# Patient Record
Sex: Male | Born: 1985 | Race: White | Hispanic: No | Marital: Married | Smoking: Never smoker
Health system: Southern US, Community
[De-identification: ages and names within clinical notes are randomized; demographics above are authoritative.]

## PROBLEM LIST (undated history)

## (undated) DIAGNOSIS — S069XAA Unspecified intracranial injury with loss of consciousness status unknown, initial encounter: Secondary | ICD-10-CM

## (undated) DIAGNOSIS — F4310 Post-traumatic stress disorder, unspecified: Secondary | ICD-10-CM

## (undated) DIAGNOSIS — S069X9A Unspecified intracranial injury with loss of consciousness of unspecified duration, initial encounter: Secondary | ICD-10-CM

## (undated) DIAGNOSIS — F1121 Opioid dependence, in remission: Secondary | ICD-10-CM

## (undated) DIAGNOSIS — G8929 Other chronic pain: Secondary | ICD-10-CM

## (undated) DIAGNOSIS — R51 Headache: Secondary | ICD-10-CM

## (undated) HISTORY — PX: HERNIA REPAIR: SHX51

## (undated) HISTORY — DX: Unspecified intracranial injury with loss of consciousness of unspecified duration, initial encounter: S06.9X9A

## (undated) HISTORY — DX: Post-traumatic stress disorder, unspecified: F43.10

## (undated) HISTORY — DX: Headache: R51

## (undated) HISTORY — DX: Other chronic pain: G89.29

## (undated) HISTORY — DX: Opioid dependence, in remission: F11.21

## (undated) HISTORY — DX: Unspecified intracranial injury with loss of consciousness status unknown, initial encounter: S06.9XAA

---

## 2009-01-03 HISTORY — PX: SHOULDER SURGERY: SHX246

## 2012-01-16 ENCOUNTER — Emergency Department (HOSPITAL_COMMUNITY)
Admission: EM | Admit: 2012-01-16 | Discharge: 2012-01-16 | Disposition: A | Discharge status: Home / Self Care | Payer: Self-pay | Attending: Emergency Medicine | Admitting: Emergency Medicine

## 2012-01-16 ENCOUNTER — Emergency Department (HOSPITAL_COMMUNITY): Payer: Self-pay

## 2012-01-16 ENCOUNTER — Encounter (HOSPITAL_COMMUNITY): Payer: Self-pay | Admitting: *Deleted

## 2012-01-16 DIAGNOSIS — Y929 Unspecified place or not applicable: Secondary | ICD-10-CM | POA: Insufficient documentation

## 2012-01-16 DIAGNOSIS — S51809A Unspecified open wound of unspecified forearm, initial encounter: Secondary | ICD-10-CM | POA: Insufficient documentation

## 2012-01-16 DIAGNOSIS — Y939 Activity, unspecified: Secondary | ICD-10-CM | POA: Insufficient documentation

## 2012-01-16 DIAGNOSIS — IMO0002 Reserved for concepts with insufficient information to code with codable children: Secondary | ICD-10-CM

## 2012-01-16 DIAGNOSIS — X58XXXA Exposure to other specified factors, initial encounter: Secondary | ICD-10-CM | POA: Insufficient documentation

## 2012-01-16 DIAGNOSIS — Z9889 Other specified postprocedural states: Secondary | ICD-10-CM | POA: Insufficient documentation

## 2012-01-16 MED ORDER — IBUPROFEN 100 MG/5ML PO SUSP: 800.0000 mg | Freq: Once | ORAL | Status: DC

## 2012-01-16 NOTE — ED Provider Notes (Signed)
History     CSN: [id number]  Arrival date & time 01/16/12  2008   First MD Initiated Contact with Patient 01/16/12 2057      Chief Complaint  Patient presents with  . Extremity Laceration    (Consider location/radiation/quality/duration/timing/severity/associated sxs/prior treatment) Patient is a 27 y.o. male presenting with skin laceration. The history is provided by the patient.  Laceration  The incident occurred 3 to 5 hours ago. The laceration is located on the left arm. The laceration is 3 cm in size. The laceration mechanism is unknown.The pain is mild. It is unknown if a foreign body is present. His tetanus status is UTD.    History reviewed. No pertinent past medical history.  Past Surgical History  Procedure Date  . Shoulder surgery     History reviewed. No pertinent family history.  History  Substance Use Topics  . Smoking status: Never Smoker   . Smokeless tobacco: Current User    Types: Chew  . Alcohol Use: Yes     Comment: rarely      Review of Systems  Constitutional: Negative for fever and chills.  Musculoskeletal: Negative.   Skin: Positive for wound.       Laceration.  Neurological: Negative.  Negative for numbness.    Allergies  Review of patient's allergies indicates no known allergies.  Home Medications   Current Outpatient Rx  Name  Route  Sig  Dispense  Refill  . ACETAMINOPHEN 500 MG PO TABS   Oral   Take 500 mg by mouth daily as needed. For pain           BP 143/79  Pulse 113  Temp 99.1 F (37.3 C) (Oral)  Resp 18  SpO2 100%  Physical Exam  Constitutional: He is oriented to person, place, and time. He appears well-developed and well-nourished.  Neck: Normal range of motion.  Cardiovascular:       Distal pulse intact.  Pulmonary/Chest: Effort normal.  Musculoskeletal: Normal range of motion.  Neurological: He is alert and oriented to person, place, and time.  Skin: Skin is warm and dry.       Full thickness, 3 cm  laceration to left volar forearm without swelling or palpable foreign body.  Psychiatric: He has a normal mood and affect.    ED Course  Procedures (including critical care time)  Labs Reviewed - No data to display Dg Forearm Left  01/16/2012  *RADIOLOGY REPORT*  Clinical Data: Extremity two-view left forearm  LEFT FOREARM - 2 VIEW  Comparison: None.  Findings: There is soft tissue swelling radiation into the area demarcated by the radiopaque arrow.  This finding without associated radiopaque foreign body or fracture. Limited visualization of the adjacent wrist and elbow is normal.  IMPRESSION: Soft tissue swelling without radiopaque foreign body or fracture.   Original Report Authenticated By: Tacey Ruiz, MD   LACERATION REPAIR Performed by: Arnoldo Hooker Authorized by: Elpidio Anis A Consent: Verbal consent obtained. Risks and benefits: risks, benefits and alternatives were discussed Consent given by: patient Patient identity confirmed: provided demographic data Prepped and Draped in normal sterile fashion Wound explored  Laceration Location: left forearm  Laceration Length: 3cm  No Foreign Bodies seen or palpated  Anesthesia: local infiltration  Local anesthetic: lidocaine 1% w/ epinephrine  Anesthetic total: 2 ml  Irrigation method: syringe Amount of cleaning: standard  Skin closure: staples  Number of sutures: 3  Technique: staples  Patient tolerance: Patient tolerated the procedure well with no  immediate complications.    No diagnosis found.  1. Left forearm laceration  MDM  Uncomplicated laceration forearm.        Arnoldo Hooker, PA-C 01/16/12 2201

## 2012-01-16 NOTE — ED Notes (Signed)
Pt states he fell in backyard and may have landed on something metal.  laceration to the left forearm, bleeding controlled at this time.

## 2012-01-16 NOTE — ED Notes (Signed)
Pt states tetanus shot received within 5 yrs. Laceration to left anterior forearm. Covered with gauze. A&Ox4, ambulatory, nad.

## 2012-01-16 NOTE — ED Notes (Signed)
Pt in xray

## 2012-01-17 NOTE — ED Provider Notes (Signed)
Medical screening examination/treatment/procedure(s) were performed by non-physician practitioner and as supervising physician I was immediately available for consultation/collaboration.  Gilda Crease, MD 01/17/12 2325

## 2012-03-07 ENCOUNTER — Encounter: Payer: Self-pay | Admitting: Internal Medicine

## 2012-03-07 ENCOUNTER — Ambulatory Visit (INDEPENDENT_AMBULATORY_CARE_PROVIDER_SITE_OTHER): Payer: PRIVATE HEALTH INSURANCE | Admitting: Internal Medicine

## 2012-03-07 VITALS — BP 112/72 | HR 76 | Temp 98.0°F | Ht 69.5 in | Wt 136.0 lb

## 2012-03-07 DIAGNOSIS — F4310 Post-traumatic stress disorder, unspecified: Secondary | ICD-10-CM

## 2012-03-07 DIAGNOSIS — S069X0S Unspecified intracranial injury without loss of consciousness, sequela: Secondary | ICD-10-CM

## 2012-03-07 DIAGNOSIS — L709 Acne, unspecified: Secondary | ICD-10-CM | POA: Insufficient documentation

## 2012-03-07 DIAGNOSIS — L708 Other acne: Secondary | ICD-10-CM

## 2012-03-07 DIAGNOSIS — S069X9S Unspecified intracranial injury with loss of consciousness of unspecified duration, sequela: Secondary | ICD-10-CM

## 2012-03-07 DIAGNOSIS — R51 Headache: Secondary | ICD-10-CM

## 2012-03-07 LAB — CBC WITH DIFFERENTIAL/PLATELET
Basophils Absolute: 0 10*3/uL (ref 0.0–0.1)
Eosinophils Absolute: 0 10*3/uL (ref 0.0–0.7)
Hemoglobin: 15.3 g/dL (ref 13.0–17.0)
Lymphocytes Relative: 41.8 % (ref 12.0–46.0)
Lymphs Abs: 2.9 10*3/uL (ref 0.7–4.0)
MCHC: 34.1 g/dL (ref 30.0–36.0)
Monocytes Absolute: 0.5 10*3/uL (ref 0.1–1.0)
Neutro Abs: 3.5 10*3/uL (ref 1.4–7.7)
RDW: 12.3 % (ref 11.5–14.6)

## 2012-03-07 LAB — T4, FREE: Free T4: 0.79 ng/dL (ref 0.60–1.60)

## 2012-03-07 LAB — HEPATIC FUNCTION PANEL
Albumin: 4.4 g/dL (ref 3.5–5.2)
Alkaline Phosphatase: 48 U/L (ref 39–117)

## 2012-03-07 LAB — BASIC METABOLIC PANEL
CO2: 32 mEq/L (ref 19–32)
Calcium: 9.6 mg/dL (ref 8.4–10.5)
Glucose, Bld: 79 mg/dL (ref 70–99)
Sodium: 142 mEq/L (ref 135–145)

## 2012-03-07 LAB — TSH: TSH: 1.38 u[IU]/mL (ref 0.35–5.50)

## 2012-03-07 MED ORDER — METHOCARBAMOL 500 MG PO TABS: 250.0000 mg | ORAL_TABLET | Freq: Two times a day (BID) | ORAL | Status: DC | PRN

## 2012-03-07 MED ORDER — ONDANSETRON HCL 4 MG PO TABS: 4.0000 mg | ORAL_TABLET | Freq: Three times a day (TID) | ORAL | Status: DC | PRN

## 2012-03-07 MED ORDER — BUPROPION HCL ER (XL) 150 MG PO TB24: 150.0000 mg | ORAL_TABLET | Freq: Every day | ORAL | Status: DC

## 2012-03-07 MED ORDER — MINOCYCLINE HCL 50 MG PO TABS: 50.0000 mg | ORAL_TABLET | Freq: Two times a day (BID) | ORAL | Status: DC

## 2012-03-07 NOTE — Assessment & Plan Note (Addendum)
Patient suffers from PTSD. He also reports he was diagnosed with ADD. He has tried and failed multiple medications including Abilify, Cymbalta, Lexapro and Zoloft. Trial of Wellbutrin XL 150 mg once daily. Refer to psychiatrist for further evaluation.

## 2012-03-07 NOTE — Assessment & Plan Note (Signed)
Patient complains of chronic back and his back. His tried and failed topical agents. Use minocycline 50 mg once daily.

## 2012-03-07 NOTE — Assessment & Plan Note (Signed)
Patient suffers with chronic headaches. This is likely secondary to history of chronic pain disorder/multiple concussions. He is evaluated by neurologist at the [facility] in the past. Extensive workup negative. Patient reports trying amitriptyline the past. He discontinued due to excessive somnolence. I strongly recommended he discontinue Use of over-the-counter analgesics. Patient at risk for NSAID ulcers. Trial of muscle relaxers for headache.
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
## 2012-04-09 ENCOUNTER — Ambulatory Visit (INDEPENDENT_AMBULATORY_CARE_PROVIDER_SITE_OTHER): Payer: PRIVATE HEALTH INSURANCE | Admitting: Internal Medicine

## 2012-04-09 ENCOUNTER — Encounter: Payer: Self-pay | Admitting: Internal Medicine

## 2012-04-09 VITALS — BP 130/80 | HR 84 | Temp 97.9°F | Wt 138.0 lb

## 2012-04-09 DIAGNOSIS — F4310 Post-traumatic stress disorder, unspecified: Secondary | ICD-10-CM

## 2012-04-09 DIAGNOSIS — R51 Headache: Secondary | ICD-10-CM

## 2012-04-09 MED ORDER — POLYETHYLENE GLYCOL 3350 17 GM/SCOOP PO POWD: 17.0000 g | Freq: Two times a day (BID) | ORAL | Status: DC | PRN

## 2012-04-09 MED ORDER — BUPROPION HCL ER (XL) 300 MG PO TB24: 300.0000 mg | ORAL_TABLET | Freq: Every day | ORAL | Status: DC

## 2012-04-09 MED ORDER — ONDANSETRON HCL 4 MG PO TABS: 4.0000 mg | ORAL_TABLET | Freq: Three times a day (TID) | ORAL | Status: DC | PRN
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
## 2012-04-09 NOTE — Assessment & Plan Note (Signed)
Some improvement with Wellbutrin XL 150 mg daily. Titrate to 300 mg. Patient requests evaluation for ADD. Change psychiatry referral to Dr. Betti Cruz.

## 2012-04-09 NOTE — Progress Notes (Signed)
  Subjective:    Patient ID: Wesley Mason, male    DOB: November 08, 1985, 27 y.o.   MRN: [id number]  HPI  27 year old [place] male previously seen for PTSD chronic headaches for followup. Patient tried Robaxin for chronic headaches. Patient increased dose to 40 mg but noticed no improvement. Patient also tried and failed amitriptyline in the past.  Zofran helping chronic nausea but it is causing constipation.   PTSD-Wellbrutin XL 150 mg is helping. Patient feels like he needs higher dose. He was referred to psychiatrist but could not follow through due to cost reasons.  Patient requests evaluation for ADD. Patient reports he was diagnosed with ADD during childhood. He took stimulants in the remote past.  Review of Systems Denies adverse effect from Wellbutrin  Past Medical History  Diagnosis Date  . Traumatic brain injury   . PTSD (post-traumatic stress disorder)   . Chronic headaches   . History of narcotic addiction     History   Social History  . Marital Status: Married    Spouse Name: N/A    Number of Children: N/A  . Years of Education: N/A   Occupational History  . Not on file.   Social History Main Topics  . Smoking status: Never Smoker   . Smokeless tobacco: Current User    Types: Chew  . Alcohol Use: Yes     Comment: rarely  . Drug Use: No  . Sexually Active:    Other Topics Concern  . Not on file   Social History Narrative  . No narrative on file    Past Surgical History  Procedure Laterality Date  . Shoulder surgery  2011    History of anterior/posterior labrum tear    Family History  Problem Relation Age of Onset  . Alcoholism Father   . Breast cancer Maternal Grandmother   . Pancreatic cancer Paternal Grandfather   . Depression Mother     No Known Allergies  Current Outpatient Prescriptions on File Prior to Visit  Medication Sig Dispense Refill  . minocycline (DYNACIN) 50 MG tablet Take 1 tablet (50 mg total) by mouth 2 (two) times daily.   30 tablet  1   No current facility-administered medications on file prior to visit.    BP 130/80  Pulse 84  Temp(Src) 97.9 F (36.6 C) (Oral)  Wt 138 lb (62.596 kg)  BMI 20.09 kg/m2       Objective:   Physical Exam  Constitutional: He is oriented to person, place, and time. He appears well-developed and well-nourished.  Cardiovascular: Normal rate, regular rhythm and normal heart sounds.   No murmur heard. Pulmonary/Chest: Effort normal and breath sounds normal. He has no wheezes.  Neurological: He is alert and oriented to person, place, and time. No cranial nerve deficit.  Psychiatric: He has a normal mood and affect. His behavior is normal.          Assessment & Plan:

## 2012-05-08 ENCOUNTER — Encounter (HOSPITAL_COMMUNITY): Payer: Self-pay

## 2012-05-08 ENCOUNTER — Emergency Department (HOSPITAL_COMMUNITY): Payer: PRIVATE HEALTH INSURANCE

## 2012-05-08 ENCOUNTER — Emergency Department (HOSPITAL_COMMUNITY)
Admission: EM | Admit: 2012-05-08 | Discharge: 2012-05-08 | Disposition: A | Discharge status: Home / Self Care | Payer: PRIVATE HEALTH INSURANCE | Attending: Emergency Medicine | Admitting: Emergency Medicine

## 2012-05-08 DIAGNOSIS — Z87820 Personal history of traumatic brain injury: Secondary | ICD-10-CM | POA: Insufficient documentation

## 2012-05-08 DIAGNOSIS — Y939 Activity, unspecified: Secondary | ICD-10-CM | POA: Insufficient documentation

## 2012-05-08 DIAGNOSIS — Z79899 Other long term (current) drug therapy: Secondary | ICD-10-CM | POA: Insufficient documentation

## 2012-05-08 DIAGNOSIS — Z9889 Other specified postprocedural states: Secondary | ICD-10-CM | POA: Insufficient documentation

## 2012-05-08 DIAGNOSIS — S46909A Unspecified injury of unspecified muscle, fascia and tendon at shoulder and upper arm level, unspecified arm, initial encounter: Secondary | ICD-10-CM | POA: Insufficient documentation

## 2012-05-08 DIAGNOSIS — X500XXA Overexertion from strenuous movement or load, initial encounter: Secondary | ICD-10-CM | POA: Insufficient documentation

## 2012-05-08 DIAGNOSIS — Y929 Unspecified place or not applicable: Secondary | ICD-10-CM | POA: Insufficient documentation

## 2012-05-08 DIAGNOSIS — Z8659 Personal history of other mental and behavioral disorders: Secondary | ICD-10-CM | POA: Insufficient documentation

## 2012-05-08 DIAGNOSIS — G8929 Other chronic pain: Secondary | ICD-10-CM | POA: Insufficient documentation

## 2012-05-08 DIAGNOSIS — R51 Headache: Secondary | ICD-10-CM | POA: Insufficient documentation

## 2012-05-08 DIAGNOSIS — M25512 Pain in left shoulder: Secondary | ICD-10-CM

## 2012-05-08 DIAGNOSIS — S4980XA Other specified injuries of shoulder and upper arm, unspecified arm, initial encounter: Secondary | ICD-10-CM | POA: Insufficient documentation

## 2012-05-08 MED ORDER — OXYCODONE-ACETAMINOPHEN 5-325 MG PO TABS: 1.0000 | ORAL_TABLET | ORAL | Status: DC | PRN

## 2012-05-08 MED ORDER — NAPROXEN 500 MG PO TABS: 500.0000 mg | ORAL_TABLET | Freq: Two times a day (BID) | ORAL | Status: DC

## 2012-05-08 NOTE — ED Provider Notes (Signed)
  Medical screening examination/treatment/procedure(s) were performed by non-physician practitioner and as supervising physician I was immediately available for consultation/collaboration.    Gerhard Munch, MD 05/08/12 2312

## 2012-05-08 NOTE — ED Provider Notes (Signed)
History    This chart was scribed for non-physician practitioner Dierdre Forth, PA-C working with Gerhard Munch, MD by Gerlean Ren, ED Scribe. This patient was seen in room WTR5/WTR5 and the patient's care was started at 9:09 PM.    CSN: [id number]  Arrival date & time 05/08/12  2026   First MD Initiated Contact with Patient 05/08/12 2041      Chief Complaint  Patient presents with  . Shoulder Pain     The history is provided by the patient. No language interpreter was used.  Wesley Mason is a 27 y.o. male who presents to the Emergency Department complaining of constant throbbing left shoulder pain rated as 8/10 and still worsening with sudden onset when pt was moving a washing machine during which pt felt his shoulder pop out of place and move back into place right away.  Pt has had post anterior labrum tear surgery after a dislocation which was performed at [facility] in 2010.  Pt reports at least one dislocation since the surgery.  Pt reports h/o PTSD that has been improving.  Pt regularly takes wellbutrin.  No known allergies.     Past Medical History  Diagnosis Date  . Traumatic brain injury   . PTSD (post-traumatic stress disorder)   . Chronic headaches   . History of narcotic addiction     Past Surgical History  Procedure Laterality Date  . Shoulder surgery  2011    History of anterior/posterior labrum tear    Family History  Problem Relation Age of Onset  . Alcoholism Father   . Breast cancer Maternal Grandmother   . Pancreatic cancer Paternal Grandfather   . Depression Mother     History  Substance Use Topics  . Smoking status: Never Smoker   . Smokeless tobacco: Current User    Types: Chew  . Alcohol Use: Yes     Comment: rarely      Review of Systems  Constitutional: Negative for fever and chills.  Respiratory: Negative for shortness of breath.   Gastrointestinal: Negative for nausea and vomiting.  Musculoskeletal: Positive for  arthralgias.       Positive left shoulder pain  Skin: Negative for wound.  Neurological: Negative for weakness.    Allergies  Review of patient's allergies indicates no known allergies.  Home Medications   Current Outpatient Rx  Name  Route  Sig  Dispense  Refill  . buPROPion (WELLBUTRIN XL) 300 MG 24 hr tablet   Oral   Take 300 mg by mouth every morning.         . naproxen (NAPROSYN) 500 MG tablet   Oral   Take 1 tablet (500 mg total) by mouth 2 (two) times daily with a meal.   30 tablet   0   . oxyCODONE-acetaminophen (PERCOCET/ROXICET) 5-325 MG per tablet   Oral   Take 1 tablet by mouth every 4 (four) hours as needed for pain.   12 tablet   0     BP 123/64  Pulse 99  Temp(Src) 98.5 F (36.9 C) (Oral)  Resp 18  SpO2 97%  Physical Exam  Nursing note and vitals reviewed. Constitutional: He is oriented to person, place, and time. He appears well-developed and well-nourished. No distress.  HENT:  Head: Normocephalic and atraumatic.  Eyes: Conjunctivae and EOM are normal.  Neck: Normal range of motion. Neck supple. No tracheal deviation present.  Cardiovascular: Normal rate, regular rhythm, normal heart sounds and intact distal pulses.   No  murmur heard. Capillary refill < 3 sec  Pulmonary/Chest: Effort normal and breath sounds normal. No respiratory distress. He has no wheezes.  Musculoskeletal: He exhibits tenderness. He exhibits no edema.       Left shoulder: He exhibits decreased range of motion, tenderness and pain. He exhibits no bony tenderness, no swelling, no effusion, no crepitus, no deformity, no laceration, no spasm, normal pulse and normal strength.       Left elbow: Normal.       Left wrist: Normal.  ROM: decreased ROM in the L shoulder 2/2 pain  Neurological: He is alert and oriented to person, place, and time. Coordination normal.  Sensation intact to dull and sharp Strength 4/5 in the LUE as compared to the RUE  Skin: Skin is warm and dry. No  rash noted. He is not diaphoretic. No erythema.  No tenting of the skin  Psychiatric: He has a normal mood and affect. His behavior is normal.    ED Course  Procedures (including critical care time) DIAGNOSTIC STUDIES: Oxygen Saturation is 97% on room air, adequate by my interpretation.    COORDINATION OF CARE: 9:20 PM- Informed pt that XR is negative for fracture or dislocation and that I can provide IM Toradol  here.  Informed pt that I cannot provide pain medicine here because he is driving and he understands. Ordered left arm sling.  Advised pt to follow-up with PCP.  Pt verbalizes understanding and agrees with plan.      Dg Shoulder Left  05/08/2012  *RADIOLOGY REPORT*  Clinical Data: Shoulder injury moving object.  LEFT SHOULDER - 2+ VIEW  Comparison: None.  Findings: Scoliosis thoracic spine.  No fracture or dislocation.  IMPRESSION: No fracture.   Original Report Authenticated By: Lacy Duverney, M.D.      1. Shoulder joint pain, left       MDM  Wesley Mason presents for acute exacerbation of chronic shoulder pain.  Patient X-Ray negative for obvious fracture or dislocation. I personally reviewed the imaging tests through PACS system.  I reviewed available ER/hospitalization records through the EMR.  Pt advised to follow up with orthopedics if symptoms persist for possibility of additional labrum tear. Patient given brace while in ED, conservative therapy recommended and discussed. Patient will be dc home & is agreeable with above plan.    I personally performed the services described in this documentation, which was scribed in my presence. The recorded information has been reviewed and is accurate.   Dahlia Client Kekai Geter, PA-C 05/08/12 2143
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
## 2012-05-15 ENCOUNTER — Ambulatory Visit (INDEPENDENT_AMBULATORY_CARE_PROVIDER_SITE_OTHER): Payer: PRIVATE HEALTH INSURANCE | Admitting: Internal Medicine

## 2012-05-15 ENCOUNTER — Encounter: Payer: Self-pay | Admitting: Internal Medicine

## 2012-05-15 ENCOUNTER — Telehealth: Payer: Self-pay | Admitting: Internal Medicine

## 2012-05-15 VITALS — BP 132/74 | Temp 98.6°F | Wt 133.0 lb

## 2012-05-15 DIAGNOSIS — M25519 Pain in unspecified shoulder: Secondary | ICD-10-CM

## 2012-05-15 DIAGNOSIS — M25512 Pain in left shoulder: Secondary | ICD-10-CM | POA: Insufficient documentation

## 2012-05-15 MED ORDER — HYDROCODONE-ACETAMINOPHEN 5-325 MG PO TABS: 1.0000 | ORAL_TABLET | Freq: Three times a day (TID) | ORAL | Status: DC | PRN

## 2012-05-15 MED ORDER — BUPROPION HCL ER (XL) 300 MG PO TB24: 300.0000 mg | ORAL_TABLET | Freq: Every morning | ORAL | Status: DC

## 2012-05-15 NOTE — Progress Notes (Signed)
  Subjective:    Patient ID: Wesley Mason, male    DOB: [birth year], 27 y.o.   MRN: [id number]  HPI  27 year old Wesley Mason male with history of previous left shoulder injury for ER followup. 3 days ago while moving a washer,he felt a ripping sensation in left shoulder. He also describes popping sound. He had left shoulder surgery in the past for a posterior and anterior labrum tear. Patient was given sling in the emergency room but he is not using as directed.  He has been using percocet for pain.   Review of Systems No redness or swelling of joint  Past Medical History  Diagnosis Date  . Traumatic brain injury   . PTSD (post-traumatic stress disorder)   . Chronic headaches   . History of narcotic addiction     History   Social History  . Marital Status: Married    Spouse Name: N/A    Number of Children: N/A  . Years of Education: N/A   Occupational History  . Not on file.   Social History Main Topics  . Smoking status: Never Smoker   . Smokeless tobacco: Current User    Types: Chew  . Alcohol Use: Yes     Comment: rarely  . Drug Use: No  . Sexually Active:    Other Topics Concern  . Not on file   Social History Narrative  . No narrative on file    Past Surgical History  Procedure Laterality Date  . Shoulder surgery  2011    History of anterior/posterior labrum tear    Family History  Problem Relation Age of Onset  . Alcoholism Father   . Breast cancer Maternal Grandmother   . Pancreatic cancer Paternal Grandfather   . Depression Mother     No Known Allergies  No current outpatient prescriptions on file prior to visit.   No current facility-administered medications on file prior to visit.    BP 132/74  Temp(Src) 98.6 F (37 C) (Oral)  Wt 133 lb (60.328 kg)  BMI 19.37 kg/m2       Objective:   Physical Exam  Constitutional: He appears well-nourished.  Cardiovascular: Normal rate, regular rhythm and normal heart sounds.   Pulmonary/Chest:  Effort normal and breath sounds normal. He has no wheezes.  Musculoskeletal:  Limited range of motion left shoulder, only able to achieve 90 abduction.  Pain with anterior translation.          Assessment & Plan:

## 2012-05-15 NOTE — Telephone Encounter (Signed)
error 

## 2012-05-15 NOTE — Assessment & Plan Note (Signed)
27 year old [place] male with previous history of left labrum tear presents with re injury.  Refer to [facility] ortho for further evaluation and treatment.  Patient advised to use shoulder sling and rest until further evaluation. He will likely need repeat MRI of left shoulder and possible surgical repair.  Use vicodin 5/325 q 8 hrs as needed for pain.

## 2012-05-15 NOTE — Patient Instructions (Signed)
Our office will contact you re: orthopedic referral. Use shoulder sling and rest as directed.

## 2012-06-11 ENCOUNTER — Ambulatory Visit: Payer: PRIVATE HEALTH INSURANCE | Admitting: Internal Medicine

## 2012-06-11 DIAGNOSIS — Z0289 Encounter for other administrative examinations: Secondary | ICD-10-CM

## 2012-06-12 ENCOUNTER — Encounter (HOSPITAL_COMMUNITY): Payer: Self-pay | Admitting: *Deleted

## 2012-06-12 ENCOUNTER — Emergency Department (HOSPITAL_COMMUNITY): Payer: Self-pay

## 2012-06-12 ENCOUNTER — Emergency Department (HOSPITAL_COMMUNITY)
Admission: EM | Admit: 2012-06-12 | Discharge: 2012-06-12 | Disposition: A | Discharge status: Home / Self Care | Payer: Self-pay | Attending: Emergency Medicine | Admitting: Emergency Medicine

## 2012-06-12 DIAGNOSIS — R51 Headache: Secondary | ICD-10-CM | POA: Insufficient documentation

## 2012-06-12 DIAGNOSIS — Y929 Unspecified place or not applicable: Secondary | ICD-10-CM | POA: Insufficient documentation

## 2012-06-12 DIAGNOSIS — Z87828 Personal history of other (healed) physical injury and trauma: Secondary | ICD-10-CM | POA: Insufficient documentation

## 2012-06-12 DIAGNOSIS — Z9889 Other specified postprocedural states: Secondary | ICD-10-CM | POA: Insufficient documentation

## 2012-06-12 DIAGNOSIS — Z8659 Personal history of other mental and behavioral disorders: Secondary | ICD-10-CM | POA: Insufficient documentation

## 2012-06-12 DIAGNOSIS — S46909A Unspecified injury of unspecified muscle, fascia and tendon at shoulder and upper arm level, unspecified arm, initial encounter: Secondary | ICD-10-CM | POA: Insufficient documentation

## 2012-06-12 DIAGNOSIS — G8929 Other chronic pain: Secondary | ICD-10-CM | POA: Insufficient documentation

## 2012-06-12 DIAGNOSIS — Z791 Long term (current) use of non-steroidal anti-inflammatories (NSAID): Secondary | ICD-10-CM | POA: Insufficient documentation

## 2012-06-12 DIAGNOSIS — X500XXA Overexertion from strenuous movement or load, initial encounter: Secondary | ICD-10-CM | POA: Insufficient documentation

## 2012-06-12 DIAGNOSIS — S4980XA Other specified injuries of shoulder and upper arm, unspecified arm, initial encounter: Secondary | ICD-10-CM | POA: Insufficient documentation

## 2012-06-12 DIAGNOSIS — Y9389 Activity, other specified: Secondary | ICD-10-CM | POA: Insufficient documentation

## 2012-06-12 DIAGNOSIS — M25512 Pain in left shoulder: Secondary | ICD-10-CM

## 2012-06-12 MED ORDER — HYDROCODONE-ACETAMINOPHEN 5-325 MG PO TABS: 1.0000 | ORAL_TABLET | Freq: Three times a day (TID) | ORAL | Status: DC | PRN

## 2012-06-12 MED ORDER — NAPROXEN 500 MG PO TABS: 500.0000 mg | ORAL_TABLET | Freq: Two times a day (BID) | ORAL | Status: DC

## 2012-06-12 NOTE — ED Notes (Signed)
PA at bedside.
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
## 2012-06-12 NOTE — ED Notes (Signed)
Ortho called for immobilizer 
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
## 2012-06-13 NOTE — ED Provider Notes (Signed)
Medical screening examination/treatment/procedure(s) were performed by non-physician practitioner and as supervising physician I was immediately available for consultation/collaboration.   Hurman Horn, MD 06/13/12 2215

## 2012-07-03 ENCOUNTER — Emergency Department (HOSPITAL_COMMUNITY)
Admission: EM | Admit: 2012-07-03 | Discharge: 2012-07-03 | Disposition: A | Discharge status: Home / Self Care | Payer: Self-pay | Attending: Emergency Medicine | Admitting: Emergency Medicine

## 2012-07-03 DIAGNOSIS — X500XXA Overexertion from strenuous movement or load, initial encounter: Secondary | ICD-10-CM | POA: Insufficient documentation

## 2012-07-03 DIAGNOSIS — S43109A Unspecified dislocation of unspecified acromioclavicular joint, initial encounter: Secondary | ICD-10-CM | POA: Insufficient documentation

## 2012-07-03 DIAGNOSIS — Y929 Unspecified place or not applicable: Secondary | ICD-10-CM | POA: Insufficient documentation

## 2012-07-03 DIAGNOSIS — Y939 Activity, unspecified: Secondary | ICD-10-CM | POA: Insufficient documentation

## 2012-07-03 DIAGNOSIS — Z8659 Personal history of other mental and behavioral disorders: Secondary | ICD-10-CM | POA: Insufficient documentation

## 2012-07-03 DIAGNOSIS — Z87820 Personal history of traumatic brain injury: Secondary | ICD-10-CM | POA: Insufficient documentation

## 2012-07-03 DIAGNOSIS — Z8679 Personal history of other diseases of the circulatory system: Secondary | ICD-10-CM | POA: Insufficient documentation

## 2012-07-03 DIAGNOSIS — S43002A Unspecified subluxation of left shoulder joint, initial encounter: Secondary | ICD-10-CM

## 2012-07-03 MED ORDER — PROMETHAZINE HCL 25 MG PO TABS: 25.0000 mg | ORAL_TABLET | Freq: Four times a day (QID) | ORAL | Status: DC | PRN

## 2012-07-03 MED ORDER — OXYCODONE-ACETAMINOPHEN 5-325 MG PO TABS: 2.0000 | ORAL_TABLET | Freq: Four times a day (QID) | ORAL | Status: DC | PRN

## 2012-07-03 NOTE — ED Provider Notes (Signed)
Medical screening examination/treatment/procedure(s) were performed by non-physician practitioner and as supervising physician I was immediately available for consultation/collaboration.  Cloris Flippo T Deangleo Passage, MD 07/03/12 2218 

## 2012-07-03 NOTE — ED Notes (Signed)
Pt states he injured his L shoulder a few months ago. Pt states the shoulder keeps popping in and out. States shoulder is in place now, but painful. Pt states he has a sling at home to wear for it. Pt states he is a [place] patient and is tired of waiting for doctor's appt. Pt states he has taken Motrin for pain, but "it's not touching it.". Pt states he drove himself here.

## 2012-07-03 NOTE — ED Provider Notes (Signed)
History    This chart was scribed for non-physician practitioner Junious Silk, PA working with Toy Baker, MD by Quintella Reichert, ED Scribe. This patient was seen in room WTR7/WTR7 and the patient's care was started at 7:33 PM .   CSN: [id number]  Arrival date & time 07/03/12  1901    Chief Complaint  Patient presents with  . Shoulder Pain    The history is provided by the patient. No language interpreter was used.     HPI Comments: Wesley Mason is a 27 y.o. male who presents to the Emergency Department complaining of progressively-worsening left shoulder pain.  Pt received anterior/posterior labrum shoulder surgery in 2011 and he reports that 3 months ago he dislocated the shoulder.  Since that time it has been subluxing 1-2 times per day.  The pain has been growing more severe for the past week and he came in today because he "can't take the pain."  He describes subluxation as "if I move it to a certain degree it will pop in and out."  Pain is described as sharp, "bone grinding," and tingling down his arm.  He has attempted to treat pain with Motrin, without relief.  Pt denies fever, sore throat, SOB, CP, abdominal pain, dysuria, rash, weakness, numbness, or any other associated symptoms.  [facility] and states he is tired of waiting for an appointment with an orthopedist.  He notes he does have an appointment to begin care with a PCP next week.    Past Medical History  Diagnosis Date  . Traumatic brain injury   . PTSD (post-traumatic stress disorder)   . Chronic headaches   . History of narcotic addiction    Past Surgical History  Procedure Laterality Date  . Shoulder surgery  2011    History of anterior/posterior labrum tear   Family History  Problem Relation Age of Onset  . Alcoholism Father   . Breast cancer Maternal Grandmother   . Pancreatic cancer Paternal Grandfather   . Depression Mother    History  Substance Use Topics  . Smoking status:  Never Smoker   . Smokeless tobacco: Current User    Types: Chew  . Alcohol Use: Yes     Comment: rarely     Review of Systems  Constitutional: Negative for fever.  HENT: Negative for sore throat.   Respiratory: Negative for shortness of breath.   Cardiovascular: Negative for chest pain.  Gastrointestinal: Negative for vomiting.  Genitourinary: Negative for dysuria.  Musculoskeletal: Positive for arthralgias.  Skin: Negative for rash.  Neurological: Negative for weakness and numbness.  All other systems reviewed and are negative.      Allergies  Review of patient's allergies indicates no known allergies.  Home Medications   Current Outpatient Rx  Name  Route  Sig  Dispense  Refill  . HYDROcodone-acetaminophen (NORCO) 5-325 MG per tablet   Oral   Take 1 tablet by mouth every 8 (eight) hours as needed for pain.   6 tablet   0   . ibuprofen (ADVIL,MOTRIN) 200 MG tablet   Oral   Take 400 mg by mouth every 6 (six) hours as needed for pain.         . naproxen (NAPROSYN) 500 MG tablet   Oral   Take 1 tablet (500 mg total) by mouth 2 (two) times daily.   30 tablet   0    BP 139/84  Pulse 118  Temp(Src) 99.3 F (37.4 C) (Oral)  Resp 17  SpO2 100%  Physical Exam  Nursing note and vitals reviewed. Constitutional: He is oriented to person, place, and time. He appears well-developed and well-nourished. No distress.  HENT:  Head: Normocephalic and atraumatic.  Right Ear: External ear normal.  Left Ear: External ear normal.  Nose: Nose normal.  Eyes: Conjunctivae are normal.  Neck: Normal range of motion. No tracheal deviation present.  Cardiovascular: Normal rate, regular rhythm and normal heart sounds.   Pulmonary/Chest: Effort normal and breath sounds normal. No stridor.  Abdominal: Soft. He exhibits no distension. There is no tenderness.  Musculoskeletal: He exhibits tenderness.  Tender over left AC joint Shoulder visibly subluxed Apprehension sign  positive Limited ROM due to joint subluxing  Neurological: He is alert and oriented to person, place, and time.  Neurovascularly intact  Skin: Skin is warm and dry. He is not diaphoretic.  Psychiatric: He has a normal mood and affect. His behavior is normal.    ED Course  Procedures (including critical care time)  DIAGNOSTIC STUDIES: Oxygen Saturation is 100% on room air, normal by my interpretation.    COORDINATION OF CARE: 7:42 PM: Discussed treatment plan which includes pain medications.  Pt expressed understanding and agreed to plan.   Labs Reviewed - No data to display  No results found.  1. Shoulder subluxation, left, initial encounter     MDM  Patient presents with continued shoulder subluxation. He has an appointment with ortho in September. He cannot get in sooner due to his insurance. He has a follow up appointment with his PCP next week who he will see for pain management until that time. Rx for short course of pain medications given. Neurovascularly intact. Compartment soft. Patient refused sling in ED as he already has slings at home. Return instructions given. Vital signs stable for discharge. Patient / Family / Caregiver informed of clinical course, understand medical decision-making process, and agree with plan.     I personally performed the services described in this documentation, which was scribed in my presence. The recorded information has been reviewed and is accurate.    Mora Bellman, PA-C 07/03/12 2105

## 2012-07-10 ENCOUNTER — Emergency Department (HOSPITAL_COMMUNITY)
Admission: EM | Admit: 2012-07-10 | Discharge: 2012-07-10 | Disposition: A | Discharge status: Home / Self Care | Payer: Self-pay | Attending: Emergency Medicine | Admitting: Emergency Medicine

## 2012-07-10 ENCOUNTER — Encounter (HOSPITAL_COMMUNITY): Payer: Self-pay | Admitting: *Deleted

## 2012-07-10 DIAGNOSIS — S43002D Unspecified subluxation of left shoulder joint, subsequent encounter: Secondary | ICD-10-CM

## 2012-07-10 DIAGNOSIS — G8911 Acute pain due to trauma: Secondary | ICD-10-CM | POA: Insufficient documentation

## 2012-07-10 DIAGNOSIS — Z9889 Other specified postprocedural states: Secondary | ICD-10-CM | POA: Insufficient documentation

## 2012-07-10 DIAGNOSIS — Z87820 Personal history of traumatic brain injury: Secondary | ICD-10-CM | POA: Insufficient documentation

## 2012-07-10 DIAGNOSIS — Z8679 Personal history of other diseases of the circulatory system: Secondary | ICD-10-CM | POA: Insufficient documentation

## 2012-07-10 DIAGNOSIS — G8929 Other chronic pain: Secondary | ICD-10-CM | POA: Insufficient documentation

## 2012-07-10 DIAGNOSIS — M25519 Pain in unspecified shoulder: Secondary | ICD-10-CM | POA: Insufficient documentation

## 2012-07-10 DIAGNOSIS — Z8659 Personal history of other mental and behavioral disorders: Secondary | ICD-10-CM | POA: Insufficient documentation

## 2012-07-10 MED ORDER — OXYCODONE-ACETAMINOPHEN 5-325 MG PO TABS
2.0000 | ORAL_TABLET | Freq: Once | ORAL | Status: AC
  Administered 2012-07-10: 2 via ORAL
  Filled 2012-07-10: qty 2

## 2012-07-10 MED ORDER — OXYCODONE-ACETAMINOPHEN 5-325 MG PO TABS: 1.0000 | ORAL_TABLET | Freq: Three times a day (TID) | ORAL | Status: DC | PRN

## 2012-07-10 NOTE — ED Notes (Signed)
Pt c/o left shoulder pain, reports injury from 6 months ago. Reports he had surgery on left shoulder previously. Pt requesting pain control.

## 2012-07-10 NOTE — ED Notes (Signed)
Pt reports subluxation  In his L shoulder.  States that his girlfriend is going to get off at 2230 and needs to leave soon, also he has a 25 yo daughter at bedside which "he needs to get her home too."  Pt also reports that he goes to the [facility].  Pt is requesting pain med.  Pt made aware that a provider needs to see him first and that this nurse cannot give him pain meds without an order.  But ensured him that this nurse will let the PA know re his concern.  Britta Mccreedy EDPA notified

## 2012-07-10 NOTE — ED Provider Notes (Signed)
History  This chart was scribed for non-physician practitioner working with Geoffery Lyons, MD by Greggory Stallion, ED scribe. This patient was seen in room WTR8/WTR8 and the patient's care was started at 9:20 PM.  CSN: [id number] Arrival date & time 07/10/12  2000   Chief Complaint  Patient presents with  . Shoulder Pain   The history is provided by the patient. No language interpreter was used.    HPI Comments: Wesley Mason is a 27 y.o. male who presents to the Emergency Department complaining of gradual onset, constant sharp left shoulder pain that started 6 months ago. There is no new injury. He denies he is drug seeking. Pt states he is here, however, seeking pain relief. He states the pain radiates to his arm. Pt states he had an injury 6 months ago and recently had surgery on it. Pt states he received anterior/posterior labrum shoulder surgery in 2011 and he reports that 3 months ago he dislocated the shoulder. Since that time it has been subluxing 1-2 times per day. Pt was seen on 07/03/2012 for the same thing and was given 12 percocet pills. Pt states he can not see a pain management because he doesn't have insurance yet. He states he is going to see his PCP in 2 days.    Past Medical History  Diagnosis Date  . Traumatic brain injury   . PTSD (post-traumatic stress disorder)   . Chronic headaches   . History of narcotic addiction    Past Surgical History  Procedure Laterality Date  . Shoulder surgery  2011    History of anterior/posterior labrum tear   Family History  Problem Relation Age of Onset  . Alcoholism Father   . Breast cancer Maternal Grandmother   . Pancreatic cancer Paternal Grandfather   . Depression Mother    History  Substance Use Topics  . Smoking status: Never Smoker   . Smokeless tobacco: Current User    Types: Chew  . Alcohol Use: Yes     Comment: rarely    Review of Systems  Constitutional: Negative for fever and diaphoresis.  HENT: Negative  for neck pain and neck stiffness.   Eyes: Negative for visual disturbance.  Respiratory: Negative for apnea and chest tightness.   Cardiovascular: Negative for palpitations.  Gastrointestinal: Negative for nausea, vomiting, diarrhea and constipation.  Genitourinary: Negative for dysuria.  Musculoskeletal: Positive for arthralgias. Negative for gait problem.       Left shoulder pain  Skin: Negative for rash.  Neurological: Negative for dizziness, weakness, light-headedness and numbness.    Allergies  Flexeril  Home Medications   Current Outpatient Rx  Name  Route  Sig  Dispense  Refill  . ibuprofen (ADVIL,MOTRIN) 200 MG tablet   Oral   Take 400 mg by mouth every 6 (six) hours as needed for pain.          BP 132/73  Pulse 90  Temp(Src) 99.2 F (37.3 C) (Oral)  Resp 18  SpO2 100%  Physical Exam  Nursing note and vitals reviewed. Constitutional: He is oriented to person, place, and time. He appears well-developed and well-nourished. No distress.  HENT:  Head: Normocephalic and atraumatic.  Eyes: Conjunctivae and EOM are normal.  Neck: Normal range of motion. Neck supple.  No meningeal signs  Cardiovascular: Normal rate, regular rhythm, normal heart sounds and intact distal pulses.  Exam reveals no gallop and no friction rub.   No murmur heard. Pulmonary/Chest: Effort normal and breath sounds normal. No respiratory  distress. He has no wheezes. He has no rales. He exhibits no tenderness.  Abdominal: Soft. Bowel sounds are normal. He exhibits no distension. There is no tenderness. There is no rebound and no guarding.  Musculoskeletal: Normal range of motion. He exhibits no edema and no tenderness.  Painful active and passive range of motion to left shoulder. Tenderness over AC joint. Sling immobilizer in place. FROM at elbow, wrist, and all digits.    Neurological: He is alert and oriented to person, place, and time. No cranial nerve deficit.  Speech is clear and goal  oriented, follows commands Sensation normal to light touch and two point discrimination Moves extremities without ataxia, coordination intact Normal gait and balance Normal strength in upper and lower extremities bilaterally including dorsiflexion and plantar flexion, strong and equal grip strength   Skin: Skin is warm and dry. He is not diaphoretic. No erythema.  Psychiatric: He has a normal mood and affect.    ED Course  Procedures (including critical care time)  DIAGNOSTIC STUDIES: Oxygen Saturation is 100% on RA, normal by my interpretation.    COORDINATION OF CARE: 10:14 PM-Discussed treatment plan which includes pain medication and ibuprofen with pt at bedside and pt agreed to plan.   Labs Reviewed - No data to display No results found. 1. Shoulder subluxation, left, subsequent encounter     MDM  Pt states over and over that he is not drug seeking. States that he is here to "get by" until he sees his PCP on Thursday, 07/12/12. Impressed upon pt the importance of his taking ownership of his chronic pain and that the ED was not an appropriate place to seek chronic pain care. Discussed with pt alternating ibuprofen and percocet to get him through as I would prescribe only 6 tablets. Provided chronic pain resource guide and ED policy on chronic pain management.  At this time there does not appear to be any evidence of an acute emergency medical condition and the patient appears stable for discharge with appropriate outpatient follow up.Diagnosis was discussed with patient along with return precaution. Pt verbalizes understanding and is agreeable to discharge.   I personally performed the services described in this documentation, which was scribed in my presence. The recorded information has been reviewed and is accurate.    Glade Nurse, PA-C 07/11/12 [phone]

## 2012-07-11 NOTE — ED Provider Notes (Signed)
Medical screening examination/treatment/procedure(s) were performed by non-physician practitioner and as supervising physician I was immediately available for consultation/collaboration.  Geoffery Lyons, MD 07/11/12 [phone]

## 2012-08-17 ENCOUNTER — Ambulatory Visit: Payer: PRIVATE HEALTH INSURANCE | Admitting: Internal Medicine

## 2012-08-25 ENCOUNTER — Emergency Department (HOSPITAL_COMMUNITY): Payer: Self-pay

## 2012-08-25 ENCOUNTER — Emergency Department (HOSPITAL_COMMUNITY)
Admission: EM | Admit: 2012-08-25 | Discharge: 2012-08-26 | Disposition: A | Discharge status: Home / Self Care | Payer: Self-pay | Attending: Emergency Medicine | Admitting: Emergency Medicine

## 2012-08-25 DIAGNOSIS — S8990XA Unspecified injury of unspecified lower leg, initial encounter: Secondary | ICD-10-CM | POA: Insufficient documentation

## 2012-08-25 DIAGNOSIS — Y939 Activity, unspecified: Secondary | ICD-10-CM | POA: Insufficient documentation

## 2012-08-25 DIAGNOSIS — G8929 Other chronic pain: Secondary | ICD-10-CM | POA: Insufficient documentation

## 2012-08-25 DIAGNOSIS — Z8659 Personal history of other mental and behavioral disorders: Secondary | ICD-10-CM | POA: Insufficient documentation

## 2012-08-25 DIAGNOSIS — Z87820 Personal history of traumatic brain injury: Secondary | ICD-10-CM | POA: Insufficient documentation

## 2012-08-25 DIAGNOSIS — R51 Headache: Secondary | ICD-10-CM | POA: Insufficient documentation

## 2012-08-25 DIAGNOSIS — W64XXXA Exposure to other animate mechanical forces, initial encounter: Secondary | ICD-10-CM | POA: Insufficient documentation

## 2012-08-25 DIAGNOSIS — M25561 Pain in right knee: Secondary | ICD-10-CM

## 2012-08-25 DIAGNOSIS — Y929 Unspecified place or not applicable: Secondary | ICD-10-CM | POA: Insufficient documentation

## 2012-08-25 MED ORDER — HYDROMORPHONE HCL PF 1 MG/ML IJ SOLN
1.0000 mg | Freq: Once | INTRAMUSCULAR | Status: AC
  Administered 2012-08-26: 1 mg via INTRAMUSCULAR
  Filled 2012-08-25: qty 1

## 2012-08-25 MED ORDER — HYDROCODONE-ACETAMINOPHEN 5-325 MG PO TABS: 1.0000 | ORAL_TABLET | ORAL | Status: DC | PRN

## 2012-08-25 NOTE — ED Notes (Signed)
Pt states his dog ran into his R knee and knocked him down by accident. Pt states he is able to bear weight on R leg, but painful. Pt arrives in wheelchair to exam room.

## 2012-08-25 NOTE — ED Provider Notes (Signed)
CSN: [id number]     Arrival date & time 08/25/12  2248 History  This chart was scribed for non-physician practitioner working with Junius Argyle, MD by Ronal Fear, ED scribe. This patient was seen in room WTR6/WTR6 and the patient's care was started at 10:56 PM.     Chief Complaint  Patient presents with  . Knee Pain   The history is provided by the patient. No language interpreter was used.   HPI Comments: Wesley Mason is a 27 y.o. male who presents to the Emergency Department complaining of sudden onset, constant sharp knee pain with associated swelling that started 2 hours ago after his dog ran into his knee. Pt states his knee buckled and he heard a loud pop. He rates his pain 9/10.  Patient states elevating his knee he says the pain while ambulating exacerbates his pain.  He has no history of previous knee injuries. Pt denies numbness as an associated symptom.   Past Medical History  Diagnosis Date  . Traumatic brain injury   . PTSD (post-traumatic stress disorder)   . Chronic headaches   . History of narcotic addiction    Past Surgical History  Procedure Laterality Date  . Shoulder surgery  2011    History of anterior/posterior labrum tear   Family History  Problem Relation Age of Onset  . Alcoholism Father   . Breast cancer Maternal Grandmother   . Pancreatic cancer Paternal Grandfather   . Depression Mother    History  Substance Use Topics  . Smoking status: Never Smoker   . Smokeless tobacco: Current User    Types: Chew  . Alcohol Use: Yes     Comment: rarely    Review of Systems  Musculoskeletal: Positive for joint swelling and arthralgias.  Neurological: Negative for numbness.  All other systems reviewed and are negative.    Allergies  Flexeril  Home Medications   Current Outpatient Rx  Name  Route  Sig  Dispense  Refill  . acetaminophen (TYLENOL) 500 MG tablet   Oral   Take 1,000 mg by mouth every 6 (six) hours as needed for pain.          Marland Kitchen HYDROcodone-acetaminophen (NORCO/VICODIN) 5-325 MG per tablet   Oral   Take 1 tablet by mouth every 4 (four) hours as needed for pain.   10 tablet   0    BP 128/77  Pulse 101  Temp(Src) 98.2 F (36.8 C) (Oral)  Resp 16  SpO2 100% Physical Exam  Nursing note and vitals reviewed. Constitutional: He is oriented to person, place, and time. He appears well-developed and well-nourished. No distress.  HENT:  Head: Normocephalic and atraumatic.  Eyes: EOM are normal.  Neck: Neck supple. No tracheal deviation present.  Cardiovascular: Normal rate and intact distal pulses.   Pulmonary/Chest: Effort normal. No respiratory distress.  Musculoskeletal:       Right knee: He exhibits decreased range of motion. He exhibits no effusion, no ecchymosis and no deformity. Tenderness found.       Right ankle: Normal.       Right foot: Normal.  Neurological: He is alert and oriented to person, place, and time.  Skin: Skin is warm and dry.  Psychiatric: He has a normal mood and affect. His behavior is normal.    ED Course  DIAGNOSTIC STUDIES: Oxygen Saturation is 100% on room air, normal by my interpretation.    COORDINATION OF CARE: 11:30 PM-Discussed treatment plan which includes support for the knee,  crutches and pain medication with pt at bedside and pt agreed to plan.     Procedures (including critical care time)  Labs Reviewed - No data to display Dg Knee Complete 4 Views Right  08/25/2012   *RADIOLOGY REPORT*  Clinical Data: Pain after injury.  RIGHT KNEE - COMPLETE 4+ VIEW  Comparison: None.  Findings: Bones, joint spaces and soft tissues are within normal. There is no fracture or dislocation.  IMPRESSION: No acute findings.   Original Report Authenticated By: Elberta Fortis, M.D.   1. Acute knee pain, right     MDM  Afebrile, NAD, non-toxic appearing, AAOx4. Neurovascularly intact. No sensory deficit. Imaging shows no fracture. Directed pt to ice injury, take acetaminophen or  ibuprofen for pain, limited prescription pain meds will be given. and to elevate and rest the injury when possible. Splinted knee for comfort and support. Advised PCP f/u. Patient is agreeable to plan. Patient is stable at time of discharge      I personally performed the services described in this documentation, which was scribed in my presence. The recorded information has been reviewed and is accurate.     Jeannetta Ellis, PA-C 08/26/12 [phone]

## 2012-08-27 NOTE — ED Provider Notes (Signed)
  Medical screening examination/treatment/procedure(s) were performed by non-physician practitioner and as supervising physician I was immediately available for consultation/collaboration.    Gerhard Munch, MD 08/27/12 Jacinta Shoe

## 2014-09-28 ENCOUNTER — Emergency Department (HOSPITAL_COMMUNITY): Payer: Self-pay

## 2014-09-28 ENCOUNTER — Emergency Department (HOSPITAL_COMMUNITY)
Admission: EM | Admit: 2014-09-28 | Discharge: 2014-09-28 | Disposition: A | Discharge status: Home / Self Care | Payer: Self-pay | Attending: Emergency Medicine | Admitting: Emergency Medicine

## 2014-09-28 ENCOUNTER — Encounter (HOSPITAL_COMMUNITY): Payer: Self-pay | Admitting: Emergency Medicine

## 2014-09-28 DIAGNOSIS — Z87828 Personal history of other (healed) physical injury and trauma: Secondary | ICD-10-CM | POA: Insufficient documentation

## 2014-09-28 DIAGNOSIS — M25512 Pain in left shoulder: Secondary | ICD-10-CM | POA: Insufficient documentation

## 2014-09-28 DIAGNOSIS — F1121 Opioid dependence, in remission: Secondary | ICD-10-CM

## 2014-09-28 DIAGNOSIS — Z8659 Personal history of other mental and behavioral disorders: Secondary | ICD-10-CM | POA: Insufficient documentation

## 2014-09-28 HISTORY — DX: Opioid dependence, in remission: F11.21

## 2014-09-28 MED ORDER — HYDROCODONE-ACETAMINOPHEN 5-325 MG PO TABS: 1.0000 | ORAL_TABLET | ORAL | Status: DC | PRN

## 2014-09-28 NOTE — ED Provider Notes (Signed)
CSN: [id number]     Arrival date & time 09/28/14  0140 History   First MD Initiated Contact with Patient 09/28/14 0401     Chief Complaint  Patient presents with  . Shoulder Pain     (Consider location/radiation/quality/duration/timing/severity/associated sxs/prior Treatment) Patient is a 29 y.o. male presenting with shoulder pain. The history is provided by the patient. No language interpreter was used.  Shoulder Pain Location:  Shoulder Time since incident:  3 days Injury: no   Shoulder location:  L shoulder Pain details:    Quality:  Aching and sharp   Radiates to:  L arm   Severity:  Moderate   Onset quality:  Gradual   Duration:  3 days   Timing:  Constant   Progression:  Worsening Chronicity:  Recurrent Dislocation: Subluxation with spontaneous reduction, per patient.   Prior injury to area:  Yes (Injury in [place] in 2010) Relieved by:  Nothing Worsened by:  Movement Ineffective treatments: Tramadol. Associated symptoms: decreased range of motion and muscle weakness (subjective)   Associated symptoms: no fever, no numbness, no swelling and no tingling     Past Medical History  Diagnosis Date  . Traumatic brain injury   . PTSD (post-traumatic stress disorder)   . Chronic headaches   . History of narcotic addiction 09/28/2014    Pt denies any narcotic addiction   Past Surgical History  Procedure Laterality Date  . Shoulder surgery  2011    History of anterior/posterior labrum tear  . Hernia repair     Family History  Problem Relation Age of Onset  . Alcoholism Father   . Breast cancer Maternal Grandmother   . Pancreatic cancer Paternal Grandfather   . Depression Mother    Social History  Substance Use Topics  . Smoking status: Never Smoker   . Smokeless tobacco: Current User    Types: Chew  . Alcohol Use: No     Comment: rarely    Review of Systems  Constitutional: Negative for fever.  Musculoskeletal: Positive for myalgias and arthralgias.   Neurological: Positive for weakness (L arm).  All other systems reviewed and are negative.   Allergies  Flexeril  Home Medications   Prior to Admission medications   Medication Sig Start Date End Date Taking? Authorizing Stetson Pelaez  traMADol (ULTRAM) 50 MG tablet Take 50 mg by mouth every 6 (six) hours as needed for moderate pain.   Yes Historical Daegan Arizmendi, MD  HYDROcodone-acetaminophen (NORCO/VICODIN) 5-325 MG per tablet Take 1 tablet by mouth every 4 (four) hours as needed. 09/28/14   [name], PA-C   BP 121/72 mmHg  Pulse 68  Temp(Src) 98 F (36.7 C) (Oral)  Resp 16  Ht  (1.778 m)  Wt 155 lb (70.308 kg)  BMI 22.24 kg/m2  SpO2 100%   Physical Exam  Constitutional: He is oriented to person, place, and time. He appears well-developed and well-nourished. No distress.  HENT:  Head: Normocephalic and atraumatic.  Eyes: Conjunctivae and EOM are normal. No scleral icterus.  Neck: Normal range of motion.  Cardiovascular: Normal rate, regular rhythm and intact distal pulses.   Distal radial pulse 2+ in the left upper extremity. Capillary refill brisk in all digits of left hand.  Pulmonary/Chest: Effort normal. No respiratory distress.  Respirations even and unlabored  Musculoskeletal: He exhibits tenderness.       Left shoulder: He exhibits decreased range of motion (Mildly decreased active range of motion with abduction of the L shoulder), tenderness and pain. He  exhibits no bony tenderness, no crepitus, no deformity, no spasm, normal pulse and normal strength.  TTP to anterior L shoulder without crepitus or deformity.  Neurological: He is alert and oriented to person, place, and time. He exhibits normal muscle tone. Coordination normal.  Sensation to light touch intact. Grip strength 5/5 in the left upper extremity.  Skin: Skin is warm and dry. No rash noted. He is not diaphoretic. No erythema. No pallor.  Psychiatric: He has a normal mood and affect. His behavior is  normal.  Nursing note and vitals reviewed.   ED Course  Procedures (including critical care time) Labs Review Labs Reviewed - No data to display  Imaging Review Dg Shoulder Left  09/28/2014   CLINICAL DATA:  Initial evaluation for acute injury. Generalized pain and weakness with limited range of motion.  EXAM: LEFT SHOULDER - 2+ VIEW  COMPARISON:  None.  FINDINGS: There is no evidence of fracture or dislocation. There is no evidence of arthropathy or other focal bone abnormality. Soft tissues are unremarkable.  IMPRESSION: No acute osseous abnormality about the shoulder.   Electronically Signed   By: Rise Mu M.D.   On: 09/28/2014 03:09   I have personally reviewed and evaluated these images and lab results as part of my medical decision-making.   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 Patient is neurovascularly intact. X-ray shows no evidence of dislocation or fracture. Patient to be given short course of Norco as well as orthopedic follow-up. He reports that he has recently relocated to the area. Records show the patient has not been seen in the emergency department for 2 years. Will refer to orthopedics. Return precautions discussed and provided. Patient agreeable to plan with no unaddressed concerns. Patient discharged in good condition; VSS.   Filed Vitals:   09/28/14 0200 09/28/14 0339  BP: 119/75 121/72  Pulse: 85 68  Temp: 98 F (36.7 C)   TempSrc: Oral   Resp: 18 16  Height:  (1.778 m)   Weight: 155 lb (70.308 kg)   SpO2: 100% 100%     [name], PA-C 09/28/14 1191  Devoria Albe, MD 09/28/14 [phone]

## 2014-09-28 NOTE — ED Notes (Signed)
Pt states he was moving apts earlier this week when he heard a pop in his L shoulder. States he injured it in [place] in 2010, and had two surgeries on it in 2011& 2015. Presently, he has pain, weakness and limited ROM.

## 2014-09-28 NOTE — Discharge Instructions (Signed)
Take 400 mg ibuprofen every 6 hours. You may take Norco as needed for severe pain. Apply ice to areas of pain/injury. Wear a shoulder sling for stability. Avoid heavy lifting over the next week. Follow-up with an orthopedist as soon as you are able.  Shoulder Pain The shoulder is the joint that connects your arms to your body. The bones that form the shoulder joint include the upper arm bone (humerus), the shoulder blade (scapula), and the collarbone (clavicle). The top of the humerus is shaped like a ball and fits into a rather flat socket on the scapula (glenoid cavity). A combination of muscles and strong, fibrous tissues that connect muscles to bones (tendons) support your shoulder joint and hold the ball in the socket. Small, fluid-filled sacs (bursae) are located in different areas of the joint. They act as cushions between the bones and the overlying soft tissues and help reduce friction between the gliding tendons and the bone as you move your arm. Your shoulder joint allows a wide range of motion in your arm. This range of motion allows you to do things like scratch your back or throw a ball. However, this range of motion also makes your shoulder more prone to pain from overuse and injury. Causes of shoulder pain can originate from both injury and overuse and usually can be grouped in the following four categories:  Redness, swelling, and pain (inflammation) of the tendon (tendinitis) or the bursae (bursitis).  Instability, such as a dislocation of the joint.  Inflammation of the joint (arthritis).  Broken bone (fracture). HOME CARE INSTRUCTIONS   Apply ice to the sore area.  Put ice in a plastic bag.  Place a towel between your skin and the bag.  Leave the ice on for 15-20 minutes, 3-4 times per day for the first 2 days, or as directed by your health care provider.  Stop using cold packs if they do not help with the pain.  If you have a shoulder sling or immobilizer, wear it as  long as your caregiver instructs. Only remove it to shower or bathe. Move your arm as little as possible, but keep your hand moving to prevent swelling.  Squeeze a soft ball or foam pad as much as possible to help prevent swelling.  Only take over-the-counter or prescription medicines for pain, discomfort, or fever as directed by your caregiver. SEEK MEDICAL CARE IF:   Your shoulder pain increases, or new pain develops in your arm, hand, or fingers.  Your hand or fingers become cold and numb.  Your pain is not relieved with medicines. SEEK IMMEDIATE MEDICAL CARE IF:   Your arm, hand, or fingers are numb or tingling.  Your arm, hand, or fingers are significantly swollen or turn [place] or blue. MAKE SURE YOU:   Understand these instructions.  Will watch your condition.  Will get help right away if you are not doing well or get worse. Document Released: 09/29/2004 Document Revised: 05/06/2013 Document Reviewed: 12/04/2010 [facility] Patient Information 2015 [place], [place]. This information is not intended to replace advice given to you by your health care provider. Make sure you discuss any questions you have with your health care provider.

## 2014-10-27 ENCOUNTER — Encounter (HOSPITAL_COMMUNITY): Payer: Self-pay | Admitting: Emergency Medicine

## 2014-10-27 ENCOUNTER — Emergency Department (HOSPITAL_COMMUNITY)
Admission: EM | Admit: 2014-10-27 | Discharge: 2014-10-27 | Disposition: A | Discharge status: Home / Self Care | Payer: Self-pay | Attending: Emergency Medicine | Admitting: Emergency Medicine

## 2014-10-27 DIAGNOSIS — G8929 Other chronic pain: Secondary | ICD-10-CM | POA: Insufficient documentation

## 2014-10-27 DIAGNOSIS — Y92007 Garden or yard of unspecified non-institutional (private) residence as the place of occurrence of the external cause: Secondary | ICD-10-CM | POA: Insufficient documentation

## 2014-10-27 DIAGNOSIS — Y9389 Activity, other specified: Secondary | ICD-10-CM | POA: Insufficient documentation

## 2014-10-27 DIAGNOSIS — Y998 Other external cause status: Secondary | ICD-10-CM | POA: Insufficient documentation

## 2014-10-27 DIAGNOSIS — Z87820 Personal history of traumatic brain injury: Secondary | ICD-10-CM | POA: Insufficient documentation

## 2014-10-27 DIAGNOSIS — Z8659 Personal history of other mental and behavioral disorders: Secondary | ICD-10-CM | POA: Insufficient documentation

## 2014-10-27 DIAGNOSIS — S8002XA Contusion of left knee, initial encounter: Secondary | ICD-10-CM | POA: Insufficient documentation

## 2014-10-27 DIAGNOSIS — W208XXA Other cause of strike by thrown, projected or falling object, initial encounter: Secondary | ICD-10-CM | POA: Insufficient documentation

## 2014-10-27 MED ORDER — HYDROCODONE-ACETAMINOPHEN 5-325 MG PO TABS
1.0000 | ORAL_TABLET | ORAL | Status: DC | PRN
Start: 2014-10-27 — End: 2015-01-14

## 2014-10-27 NOTE — ED Notes (Addendum)
Pt was working in the yard and a log fell, rolling down his leg hitting his knee cap.  Pt iced and elevated.  He took Tramadol PTA.

## 2014-10-27 NOTE — ED Provider Notes (Signed)
CSN: [id number]     Arrival date & time 10/27/14  2010 History  By signing my name below, I, Wesley Mason, attest that this documentation has been prepared under the direction and in the presence of [employer], PA-C. Electronically Signed: Sonum Mason, Neurosurgeon. 10/27/2014. 9:11 PM.    Chief Complaint  Patient presents with  . Knee Injury   The history is provided by the patient. No language interpreter was used.    HPI Comments: Wesley Mason is a 29 y.o. male who presents to the Emergency Department complaining of a left leg injury that occurred earlier today. He states he was working in the yard when a log fell, rolling down his left leg and striking his left knee. He complains of constant, gradually worsened left knee pain with radiation down the back of the lower leg. He describes the pain as stabbing and is worse with bearing weight. He has applied ice and elevated the left leg without relief. He has taken a Tramadol PTA. He denies prior injury to the left knee. He denies numbness, weakness.   Past Medical History  Diagnosis Date  . Traumatic brain injury (HCC)   . PTSD (post-traumatic stress disorder)   . Chronic headaches   . History of narcotic addiction (HCC) 09/28/2014    Pt denies any narcotic addiction   Past Surgical History  Procedure Laterality Date  . Shoulder surgery  2011    History of anterior/posterior labrum tear  . Hernia repair     Family History  Problem Relation Age of Onset  . Alcoholism Father   . Breast cancer Maternal Grandmother   . Pancreatic cancer Paternal Grandfather   . Depression Mother    Social History  Substance Use Topics  . Smoking status: Never Smoker   . Smokeless tobacco: Current User    Types: Chew  . Alcohol Use: No     Comment: rarely    Review of Systems  Musculoskeletal: Positive for myalgias and arthralgias (left lower leg).  Neurological: Negative for weakness and numbness.  All other systems reviewed and are  negative.     Allergies  Flexeril  Home Medications   Prior to Admission medications   Medication Sig Start Date End Date Taking? Authorizing Provider  HYDROcodone-acetaminophen (NORCO/VICODIN) 5-325 MG tablet Take 1 tablet by mouth every 4 (four) hours as needed. 10/27/14   [name], PA-C  traMADol (ULTRAM) 50 MG tablet Take 50 mg by mouth every 6 (six) hours as needed for moderate pain.    Historical Provider, MD   BP 131/64 mmHg  Pulse 94  Resp 16  SpO2 97%   Physical Exam  Constitutional: He is oriented to person, place, and time. He appears well-developed and well-nourished. No distress.  HENT:  Head: Normocephalic and atraumatic.  Eyes: Conjunctivae and EOM are normal. No scleral icterus.  Neck: Normal range of motion.  Cardiovascular: Normal rate, regular rhythm and intact distal pulses.   DP and PT pulses 2+ in the left lower extremity  Pulmonary/Chest: Effort normal. No respiratory distress.  Musculoskeletal: Normal range of motion. He exhibits tenderness.       Left knee: He exhibits swelling (Mild, inferior to the patella). He exhibits normal range of motion, no effusion, no deformity, no laceration, normal alignment, no LCL laxity, normal patellar mobility, no bony tenderness and no MCL laxity. Tenderness (Tenderness superior to the left patella.) found.       Left upper leg: Normal.       Left  lower leg: Normal.       Legs: Neurological: He is alert and oriented to person, place, and time. He exhibits normal muscle tone. Coordination normal.  Sensation to light touch intact. Patellar and Achilles reflexes in the left lower extremity intact. Patient ambulatory with antalgic gait.  Skin: Skin is warm and dry. No rash noted. He is not diaphoretic. No erythema. No pallor.  Psychiatric: He has a normal mood and affect. His behavior is normal.  Nursing note and vitals reviewed.   ED Course  Procedures (including critical care time)  DIAGNOSTIC STUDIES: Oxygen  Saturation is 97% on RA, adequate by my interpretation.    COORDINATION OF CARE: 9:15 PM Discussed treatment plan with pt at bedside and pt agreed to plan.   Labs Review Labs Reviewed - No data to display  Imaging Review No results found.    EKG Interpretation None      MDM   Final diagnoses:  Knee contusion, left, initial encounter    29 year old male presents the emergency department for symptoms consistent with left knee contusion. Patient is neurovascularly intact and ambulatory. No concern for septic joint or hemarthrosis. No bony deformity or crepitus noted. Range of motion of the left knee is normal. No indication for x-ray at this time. Will treat supportively with knee sleeve and icing. Patient given short course of Norco for pain control. Return precautions given at discharge. Patient agreeable to plan with no unaddressed concerns. Patient discharged in good condition.  I personally performed the services described in this documentation, which was scribed in my presence. The recorded information has been reviewed and is accurate.    Filed Vitals:   10/27/14 2048  BP: 131/64  Pulse: 94  Resp: 16  SpO2: 97%     Antony MaduraKelly Bradshaw Minihan, PA-C 10/27/14 2155  Bethann BerkshireJoseph Zammit, MD 10/30/14 [phone]

## 2014-10-27 NOTE — Discharge Instructions (Signed)
You declined crutches in the emergency department as you stated that you had a pair at home to use. Use crutches when walking to rest your left leg and knee. Take 600 mg ibuprofen every 6 hours for inflammation. Take Norco as needed for severe pain. Apply ice to areas of injury 3-4 times per day for 15-20 minutes each time. Follow-up with your primary care doctor in 1 week for recheck.  RICE for Routine Care of Injuries Theroutine careofmanyinjuriesincludes rest, ice, compression, and elevation (RICE therapy). RICE therapy is often recommended for injuries to soft tissues, such as a muscle strain, ligament injuries, bruises, and overuse injuries. It can also be used for some bony injuries. Using RICE therapy can help to relieve pain, lessen swelling, and enable your body to heal. Rest Rest is required to allow your body to heal. This usually involves reducing your normal activities and avoiding use of the injured part of your body. Generally, you can return to your normal activities when you are comfortable and have been given permission by your health care provider. Ice Icing your injury helps to keep the swelling down, and it lessens pain. Do not apply ice directly to your skin.  Put ice in a plastic bag.  Place a towel between your skin and the bag.  Leave the ice on for 20 minutes, 2-3 times a day. Do this for as long as you are directed by your health care provider. Compression Compression means putting pressure on the injured area. Compression helps to keep swelling down, gives support, and helps with discomfort. Compression may be done with an elastic bandage. If an elastic bandage has been applied, follow these general tips:  Remove and reapply the bandage every 3-4 hours or as directed by your health care provider.  Make sure the bandage is not wrapped too tightly, because this can cut off circulation. If part of your body beyond the bandage becomes blue, numb, cold, swollen, or  more painful, your bandage is most likely too tight. If this occurs, remove your bandage and reapply it more loosely.  See your health care provider if the bandage seems to be making your problems worse rather than better. Elevation Elevation means keeping the injured area raised. This helps to lessen swelling and decrease pain. If possible, your injured area should be elevated at or above the level of your heart or the center of your chest. WHEN SHOULD I SEEK MEDICAL CARE? You should seek medical care if:  Your pain and swelling continue.  Your symptoms are getting worse rather than improving. These symptoms may indicate that further evaluation or further X-rays are needed. Sometimes, X-rays may not show a small broken bone (fracture) until a number of days later. Make a follow-up appointment with your health care provider. WHEN SHOULD I SEEK IMMEDIATE MEDICAL CARE? You should seek immediate medical care if:  You have sudden severe pain at or below the area of your injury.  You have redness or increased swelling around your injury.  You have tingling or numbness at or below the area of your injury that does not improve after you remove the elastic bandage.   This information is not intended to replace advice given to you by your health care provider. Make sure you discuss any questions you have with your health care provider.   Document Released: 04/03/2000 Document Revised: 09/10/2014 Document Reviewed: 11/27/2013 Else[employer] Interactive Patient Education [employer]2016 [employer]

## 2014-11-19 ENCOUNTER — Encounter (HOSPITAL_COMMUNITY): Payer: Self-pay | Admitting: Emergency Medicine

## 2014-11-19 ENCOUNTER — Emergency Department (HOSPITAL_COMMUNITY): Payer: Self-pay

## 2014-11-19 ENCOUNTER — Emergency Department (HOSPITAL_COMMUNITY)
Admission: EM | Admit: 2014-11-19 | Discharge: 2014-11-20 | Disposition: A | Discharge status: Home / Self Care | Payer: Self-pay | Attending: Emergency Medicine | Admitting: Emergency Medicine

## 2014-11-19 DIAGNOSIS — Z87820 Personal history of traumatic brain injury: Secondary | ICD-10-CM | POA: Insufficient documentation

## 2014-11-19 DIAGNOSIS — M545 Low back pain, unspecified: Secondary | ICD-10-CM

## 2014-11-19 DIAGNOSIS — Y9289 Other specified places as the place of occurrence of the external cause: Secondary | ICD-10-CM | POA: Insufficient documentation

## 2014-11-19 DIAGNOSIS — Y9389 Activity, other specified: Secondary | ICD-10-CM | POA: Insufficient documentation

## 2014-11-19 DIAGNOSIS — S3992XA Unspecified injury of lower back, initial encounter: Secondary | ICD-10-CM | POA: Insufficient documentation

## 2014-11-19 DIAGNOSIS — G8929 Other chronic pain: Secondary | ICD-10-CM | POA: Insufficient documentation

## 2014-11-19 DIAGNOSIS — Z8659 Personal history of other mental and behavioral disorders: Secondary | ICD-10-CM | POA: Insufficient documentation

## 2014-11-19 DIAGNOSIS — Y998 Other external cause status: Secondary | ICD-10-CM | POA: Insufficient documentation

## 2014-11-19 DIAGNOSIS — W11XXXA Fall on and from ladder, initial encounter: Secondary | ICD-10-CM | POA: Insufficient documentation

## 2014-11-19 MED ORDER — KETOROLAC TROMETHAMINE 60 MG/2ML IM SOLN
60.0000 mg | Freq: Once | INTRAMUSCULAR | Status: AC
  Administered 2014-11-19: 60 mg via INTRAMUSCULAR
  Filled 2014-11-19: qty 2

## 2014-11-19 NOTE — Discharge Instructions (Signed)
Your x-ray show no fractures or obvious injury. You may take ibuprofen and Tylenol for pain. Also may use heat to help alleviate muscle spasms.  Back Pain, Adult Back pain is very common in adults.The cause of back pain is rarely dangerous and the pain often gets better over time.The cause of your back pain may not be known. Some common causes of back pain include:  Strain of the muscles or ligaments supporting the spine.  Wear and tear (degeneration) of the spinal disks.  Arthritis.  Direct injury to the back. For many people, back pain may return. Since back pain is rarely dangerous, most people can learn to manage this condition on their own. HOME CARE INSTRUCTIONS Watch your back pain for any changes. The following actions may help to lessen any discomfort you are feeling:  Remain active. It is stressful on your back to sit or stand in one place for long periods of time. Do not sit, drive, or stand in one place for more than 30 minutes at a time. Take short walks on even surfaces as soon as you are able.Try to increase the length of time you walk each day.  Exercise regularly as directed by your health care provider. Exercise helps your back heal faster. It also helps avoid future injury by keeping your muscles strong and flexible.  Do not stay in bed.Resting more than 1-2 days can delay your recovery.  Pay attention to your body when you bend and lift. The most comfortable positions are those that put less stress on your recovering back. Always use proper lifting techniques, including:  Bending your knees.  Keeping the load close to your body.  Avoiding twisting.  Find a comfortable position to sleep. Use a firm mattress and lie on your side with your knees slightly bent. If you lie on your back, put a pillow under your knees.  Avoid feeling anxious or stressed.Stress increases muscle tension and can worsen back pain.It is important to recognize when you are anxious or  stressed and learn ways to manage it, such as with exercise.  Take medicines only as directed by your health care provider. Over-the-counter medicines to reduce pain and inflammation are often the most helpful.Your health care provider may prescribe muscle relaxant drugs.These medicines help dull your pain so you can more quickly return to your normal activities and healthy exercise.  Apply ice to the injured area:  Put ice in a plastic bag.  Place a towel between your skin and the bag.  Leave the ice on for 20 minutes, 2-3 times a day for the first 2-3 days. After that, ice and heat may be alternated to reduce pain and spasms.  Maintain a healthy weight. Excess weight puts extra stress on your back and makes it difficult to maintain good posture. SEEK MEDICAL CARE IF:  You have pain that is not relieved with rest or medicine.  You have increasing pain going down into the legs or buttocks.  You have pain that does not improve in one week.  You have night pain.  You lose weight.  You have a fever or chills. SEEK IMMEDIATE MEDICAL CARE IF:   You develop new bowel or bladder control problems.  You have unusual weakness or numbness in your arms or legs.  You develop nausea or vomiting.  You develop abdominal pain.  You feel faint.   This information is not intended to replace advice given to you by your health care provider. Make sure you discuss  any questions you have with your health care provider.   Document Released: 12/20/2004 Document Revised: 01/10/2014 Document Reviewed: 04/23/2013 Elsevier Interactive Patient Education [employer].

## 2014-11-19 NOTE — ED Notes (Addendum)
Pt states that he was on a ladder putting up Christmas lights off an 8 ft ladder. States he landed on his lower back/buttock area and is now having pain in that area and numbness/tingling in his lower extremities. Denies LOC. Alert and oriented.

## 2014-11-19 NOTE — ED Provider Notes (Signed)
CSN: [id number]     Arrival date & time 11/19/14  2210 History  By signing my name below, I, Wesley Mason, attest that this documentation has been prepared under the direction and in the presence of Loren Raceravid Mehak Roskelley, MD. Electronically Signed: Lyndel SafeKaitlyn Mason, ED Scribe. 11/19/2014. 3:45 AM.  Chief Complaint  Patient presents with  . Fall   Patient is a 29 y.o. male presenting with fall. The history is provided by the patient. No language interpreter was used.  Fall This is a new problem. The current episode started 3 to 5 hours ago. The problem occurs constantly. The problem has not changed since onset.Pertinent negatives include no chest pain, no abdominal pain, no headaches and no shortness of breath. Exacerbated by: palpation. Nothing relieves the symptoms. Treatments tried: NSAIDs. The treatment provided no relief.   HPI Comments: Wesley Mason is a 29 y.o. male, with C-collar placed in triage, who presents to the Emergency Department complaining of a fall that occurred 4 hours ago. Pt reports he was hanging christmas lights while on a ladder when the ladder began to move and the patient pushed off the ladder falling 7-8 ft onto his lower back/buttock. Pt complains of lumbar region back pain. He immediately took ibuprofen without significant relief. He denies LOC, and abdominal pain. No focal weakness or numbness. Patient ambulating.   Past Medical History  Diagnosis Date  . Traumatic brain injury (HCC)   . PTSD (post-traumatic stress disorder)   . Chronic headaches   . History of narcotic addiction (HCC) 09/28/2014    Pt denies any narcotic addiction   Past Surgical History  Procedure Laterality Date  . Shoulder surgery  2011    History of anterior/posterior labrum tear  . Hernia repair     Family History  Problem Relation Age of Onset  . Alcoholism Father   . Breast cancer Maternal Grandmother   . Pancreatic cancer Paternal Grandfather   . Depression Mother    Social  History  Substance Use Topics  . Smoking status: Never Smoker   . Smokeless tobacco: Current User    Types: Chew  . Alcohol Use: No     Comment: rarely    Review of Systems  Constitutional: Negative for fever and chills.  Respiratory: Negative for shortness of breath.   Cardiovascular: Negative for chest pain.  Gastrointestinal: Negative for abdominal pain.  Genitourinary: Negative for difficulty urinating.  Musculoskeletal: Positive for back pain. Negative for neck pain.  Skin: Negative for rash and wound.  Neurological: Negative for dizziness, syncope, weakness, light-headedness, numbness and headaches.  All other systems reviewed and are negative.  Allergies  Flexeril  Home Medications   Prior to Admission medications   Medication Sig Start Date End Date Taking? Authorizing Provider  ibuprofen (ADVIL,MOTRIN) 200 MG tablet Take 800 mg by mouth every 6 (six) hours as needed for moderate pain.   Yes Historical Provider, MD  HYDROcodone-acetaminophen (NORCO/VICODIN) 5-325 MG tablet Take 1 tablet by mouth every 4 (four) hours as needed. Patient not taking: Reported on 11/19/2014 10/27/14   Antony MaduraKelly Humes, PA-C   BP 137/89 mmHg  Pulse 98  Temp(Src) 97.6 F (36.4 C) (Oral)  Resp 16  SpO2 100% Physical Exam  Constitutional: He is oriented to person, place, and time. He appears well-developed and well-nourished. No distress.  Patient is in no distress.  HENT:  Head: Normocephalic and atraumatic.  Mouth/Throat: Oropharynx is clear and moist.  No trauma to the face or head.  Eyes: EOM are normal.  Pupils are equal, round, and reactive to light.  Neck: Normal range of motion. Neck supple.  No posterior midline cervical tenderness to palpation. Full range of motion of the neck.  Cardiovascular: Normal rate and regular rhythm.  Exam reveals no gallop and no friction rub.   No murmur heard. Pulmonary/Chest: Effort normal and breath sounds normal. No respiratory distress. He has no  wheezes. He has no rales. He exhibits no tenderness.  Abdominal: Soft. Bowel sounds are normal. He exhibits no distension and no mass. There is no tenderness. There is no rebound and no guarding.  Musculoskeletal: Normal range of motion. He exhibits tenderness. He exhibits no edema.  Mild diffuse lumbar tenderness to palpation. There is no focality. Pelvis is stable. Patient had full range of motion of bilateral lower extremities. Distal pulses intact.  Neurological: He is alert and oriented to person, place, and time.  5/5 motor in all extremities. Sensation is fully intact. Patient is ambulatory.   Skin: Skin is warm and dry. No rash noted. No erythema.  Psychiatric: He has a normal mood and affect. His behavior is normal.  Nursing note and vitals reviewed.   ED Course  Procedures  DIAGNOSTIC STUDIES: Oxygen Saturation is 100% on RA, normal by my interpretation.    COORDINATION OF CARE: 11:17 PM Discussed treatment plan with pt at bedside and pt agreed to plan. Will order imaging.   Labs Review Labs Reviewed - No data to display  Imaging Review No results found. I have personally reviewed and evaluated these images and lab results as part of my medical decision-making.   EKG Interpretation None      MDM   Final diagnoses:  Low back pain without sciatica, unspecified back pain laterality    I personally performed the services described in this documentation, which was scribed in my presence. The recorded information has been reviewed and is accurate.   Patient presents 4 hours after fall complaining of low back pain. There is no obvious signs of trauma. The patient is neurologically intact. Patient's had multiple presentations to the emergency department complaining of muscular skeletal pain after trauma with history of narcotic addiction. Given IM Toradol in the emergency department. We'll get plain lumbar films though have low suspicion for fracture. Anticipate discharge  home. X-rays without any acute findings. Patient is requesting narcotics. He is in no distress. We'll give ibuprofen and muscle relaxant. Return precautions given.  Loren Racer, MD 11/25/14 [phone]

## 2014-11-20 NOTE — ED Notes (Signed)
Pt ambulating independently w/ steady gait on d/c in no acute distress, A&Ox4. D/c instructions reviewed w/ pt - pt denies any further questions or concerns at present.  

## 2015-01-14 ENCOUNTER — Emergency Department (HOSPITAL_COMMUNITY): Payer: Self-pay

## 2015-01-14 ENCOUNTER — Emergency Department (HOSPITAL_COMMUNITY)
Admission: EM | Admit: 2015-01-14 | Discharge: 2015-01-14 | Disposition: A | Discharge status: Home / Self Care | Payer: Self-pay | Attending: Emergency Medicine | Admitting: Emergency Medicine

## 2015-01-14 ENCOUNTER — Encounter (HOSPITAL_COMMUNITY): Payer: Self-pay | Admitting: Emergency Medicine

## 2015-01-14 DIAGNOSIS — G8929 Other chronic pain: Secondary | ICD-10-CM | POA: Insufficient documentation

## 2015-01-14 DIAGNOSIS — W000XXA Fall on same level due to ice and snow, initial encounter: Secondary | ICD-10-CM | POA: Insufficient documentation

## 2015-01-14 DIAGNOSIS — Y9289 Other specified places as the place of occurrence of the external cause: Secondary | ICD-10-CM | POA: Insufficient documentation

## 2015-01-14 DIAGNOSIS — Z8659 Personal history of other mental and behavioral disorders: Secondary | ICD-10-CM | POA: Insufficient documentation

## 2015-01-14 DIAGNOSIS — Y9389 Activity, other specified: Secondary | ICD-10-CM | POA: Insufficient documentation

## 2015-01-14 DIAGNOSIS — Y998 Other external cause status: Secondary | ICD-10-CM | POA: Insufficient documentation

## 2015-01-14 DIAGNOSIS — S4991XA Unspecified injury of right shoulder and upper arm, initial encounter: Secondary | ICD-10-CM | POA: Insufficient documentation

## 2015-01-14 DIAGNOSIS — M25511 Pain in right shoulder: Secondary | ICD-10-CM

## 2015-01-14 MED ORDER — HYDROCODONE-ACETAMINOPHEN 5-325 MG PO TABS: 1.0000 | ORAL_TABLET | Freq: Four times a day (QID) | ORAL | Status: AC | PRN

## 2015-01-14 MED ORDER — NAPROXEN 500 MG PO TABS: 500.0000 mg | ORAL_TABLET | Freq: Two times a day (BID) | ORAL | Status: AC

## 2015-01-14 NOTE — Discharge Instructions (Signed)
Naproxen for pain and inflammation. Norco for severe pain. Ice several times a day. Sling as needed. Follow-up with your orthopedic specialist.  Shoulder Pain The shoulder is the joint that connects your arms to your body. The bones that form the shoulder joint include the upper arm bone (humerus), the shoulder blade (scapula), and the collarbone (clavicle). The top of the humerus is shaped like a ball and fits into a rather flat socket on the scapula (glenoid cavity). A combination of muscles and strong, fibrous tissues that connect muscles to bones (tendons) support your shoulder joint and hold the ball in the socket. Small, fluid-filled sacs (bursae) are located in different areas of the joint. They act as cushions between the bones and the overlying soft tissues and help reduce friction between the gliding tendons and the bone as you move your arm. Your shoulder joint allows a wide range of motion in your arm. This range of motion allows you to do things like scratch your back or throw a ball. However, this range of motion also makes your shoulder more prone to pain from overuse and injury. Causes of shoulder pain can originate from both injury and overuse and usually can be grouped in the following four categories:  Redness, swelling, and pain (inflammation) of the tendon (tendinitis) or the bursae (bursitis).  Instability, such as a dislocation of the joint.  Inflammation of the joint (arthritis).  Broken bone (fracture). HOME CARE INSTRUCTIONS   Apply ice to the sore area.  Put ice in a plastic bag.  Place a towel between your skin and the bag.  Leave the ice on for 15-20 minutes, 3-4 times per day for the first 2 days, or as directed by your health care provider.  Stop using cold packs if they do not help with the pain.  If you have a shoulder sling or immobilizer, wear it as long as your caregiver instructs. Only remove it to shower or bathe. Move your arm as little as possible, but  keep your hand moving to prevent swelling.  Squeeze a soft ball or foam pad as much as possible to help prevent swelling.  Only take over-the-counter or prescription medicines for pain, discomfort, or fever as directed by your caregiver. SEEK MEDICAL CARE IF:   Your shoulder pain increases, or new pain develops in your arm, hand, or fingers.  Your hand or fingers become cold and numb.  Your pain is not relieved with medicines. SEEK IMMEDIATE MEDICAL CARE IF:   Your arm, hand, or fingers are numb or tingling.  Your arm, hand, or fingers are significantly swollen or turn Mandel or blue. MAKE SURE YOU:   Understand these instructions.  Will watch your condition.  Will get help right away if you are not doing well or get worse.   This information is not intended to replace advice given to you by your health care provider. Make sure you discuss any questions you have with your health care provider.   Document Released: 09/29/2004 Document Revised: 01/10/2014 Document Reviewed: 04/14/2014 Elsevier Interactive Patient Education [employer] Elsevier Inc[employer]

## 2015-01-14 NOTE — ED Notes (Signed)
Patient was alert, oriented and stable upon discharge. RN went over AVS and patient had no further questions.  

## 2015-01-14 NOTE — ED Notes (Signed)
Pt states he slipped and fell on ice yesterday, knocking right shoulder out of place. Popped it back in himself but states now it hurts to move arm up.

## 2015-01-14 NOTE — ED Provider Notes (Signed)
CSN: [id number]     Arrival date & time 01/14/15  1745 History  By signing my name below, I, Gonzella LexKimberly Bianca Gray, attest that this documentation has been prepared under the direction and in the presence of Zyliah Schier, PA-C. Electronically Signed: Gonzella LexKimberly Bianca Gray, Scribe. 01/14/2015. 7:01 PM.    Chief Complaint  Patient presents with  . Shoulder Injury    The history is provided by the patient. No language interpreter was used.    HPI Comments: Wesley Mason is a 30 y.o. male who presents to the Emergency Department complaining of constant, mild right shoulder pain after slipping and falling on the ice one day ago. Pt reports that he dislocated his shoulder during the fall and relocated it himself. He has tried taking ibuprofen and pain medication with no relief.   Past Medical History  Diagnosis Date  . Traumatic brain injury (HCC)   . PTSD (post-traumatic stress disorder)   . Chronic headaches   . History of narcotic addiction (HCC) 09/28/2014    Pt denies any narcotic addiction   Past Surgical History  Procedure Laterality Date  . Shoulder surgery  2011    History of anterior/posterior labrum tear  . Hernia repair     Family History  Problem Relation Age of Onset  . Alcoholism Father   . Breast cancer Maternal Grandmother   . Pancreatic cancer Paternal Grandfather   . Depression Mother    Social History  Substance Use Topics  . Smoking status: Never Smoker   . Smokeless tobacco: Current User    Types: Chew  . Alcohol Use: No     Comment: rarely    Review of Systems  Musculoskeletal: Positive for myalgias and arthralgias.   Allergies  Flexeril  Home Medications   Prior to Admission medications   Medication Sig Start Date End Date Taking? Authorizing Provider  ibuprofen (ADVIL,MOTRIN) 200 MG tablet Take 400 mg by mouth every 6 (six) hours as needed for moderate pain.    Yes Historical Provider, MD   BP 150/73 mmHg  Pulse 90  Temp(Src) 98.1  F (36.7 C) (Oral)  Resp 18  SpO2 100% Physical Exam  Constitutional: He is oriented to person, place, and time. He appears well-developed and well-nourished. No distress.  HENT:  Head: Normocephalic and atraumatic.  Eyes: Conjunctivae are normal.  Cardiovascular: Normal rate, regular rhythm and normal heart sounds.   Pulmonary/Chest: Effort normal and breath sounds normal.  Abdominal: He exhibits no distension.  Musculoskeletal:  Normal-appearing right shoulder. Tender to palpation over right anterior joints. No palpation of the lateral posterior shoulder joint. No deformity noted. Pain with full flexion and abduction of the shoulder. Pain with internal/external rotation. Elbow is normal. Bicep, tricep strength intact. Sensation intact over upper arm and forearm. Distal radial pulse intact  Neurological: He is alert and oriented to person, place, and time.  Skin: Skin is warm and dry.  Psychiatric: He has a normal mood and affect.  Nursing note and vitals reviewed.   ED Course  Procedures (including critical care time) DIAGNOSTIC STUDIES:    Oxygen Saturation is 100% on RA, normal by my interpretation.   COORDINATION OF CARE:  6:50 PM Will prescribe pt pain medication for a couple days. Advise pt to wear sling and follow up with an orthopedist. Discussed treatment plan with pt at bedside and pt agreed to plan.   Imaging Review Dg Shoulder Right  01/14/2015  CLINICAL DATA:  Larey SeatFell yesterday on ice and injured shoulder. EXAM:  RIGHT SHOULDER - 2+ VIEW COMPARISON:  None. FINDINGS: The joint spaces are maintained. No acute bony findings or bone lesion. No abnormal soft tissue calcifications. The visualized lung is clear and the visualized ribs are intact. IMPRESSION: No fracture or dislocation. Electronically Signed   By: Rudie Meyer M.D.   On: 01/14/2015 18:33   I have personally reviewed and evaluated these images as part of my medical decision-making.   MDM   Final diagnoses:   Shoulder pain, right   Patient emergency department with right shoulder pain after dislocating it yesterday. Patient states that she was able to pop it in himself. He states he has had 2 shoulder surgeries on left side. States unable to follow up soon bc has to go through [place]. Neurovascularly intact. Advised to ice. Sling. Follow up with his orthopedics specialist.   Filed Vitals:   01/14/15 1754  BP: 150/73  Pulse: 90  Temp: 98.1 F (36.7 C)  TempSrc: Oral  Resp: 18  SpO2: 100%     Jaynie Crumble, PA-C 01/14/15 1909  Leta Baptist, MD 01/18/15 [phone]

## 2016-10-20 IMAGING — CR DG SHOULDER 2+V*R*
3 series · 3 of 3 positions shown · non-contrast
Comparison: None.

CLINICAL DATA: Fell yesterday on ice and injured shoulder.

EXAM:
RIGHT SHOULDER - 2+ VIEW

[w shoulder external right]
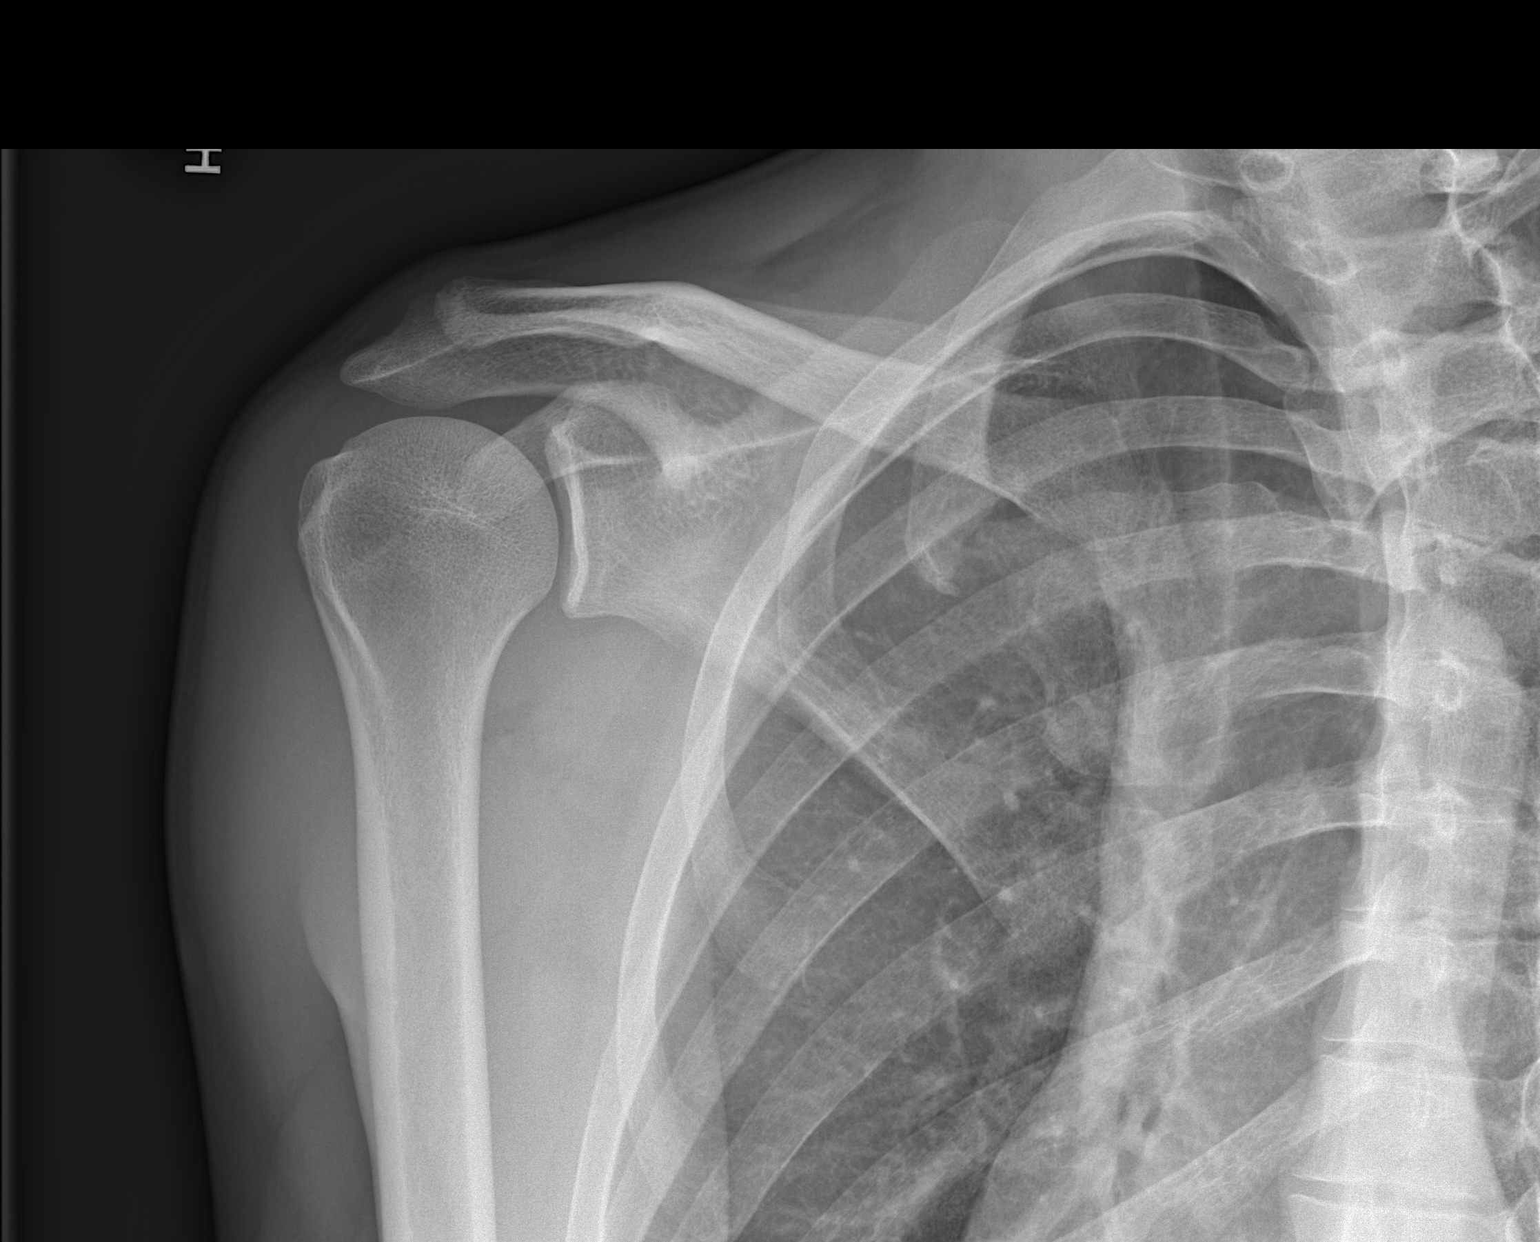

[w shoulder y-view right]
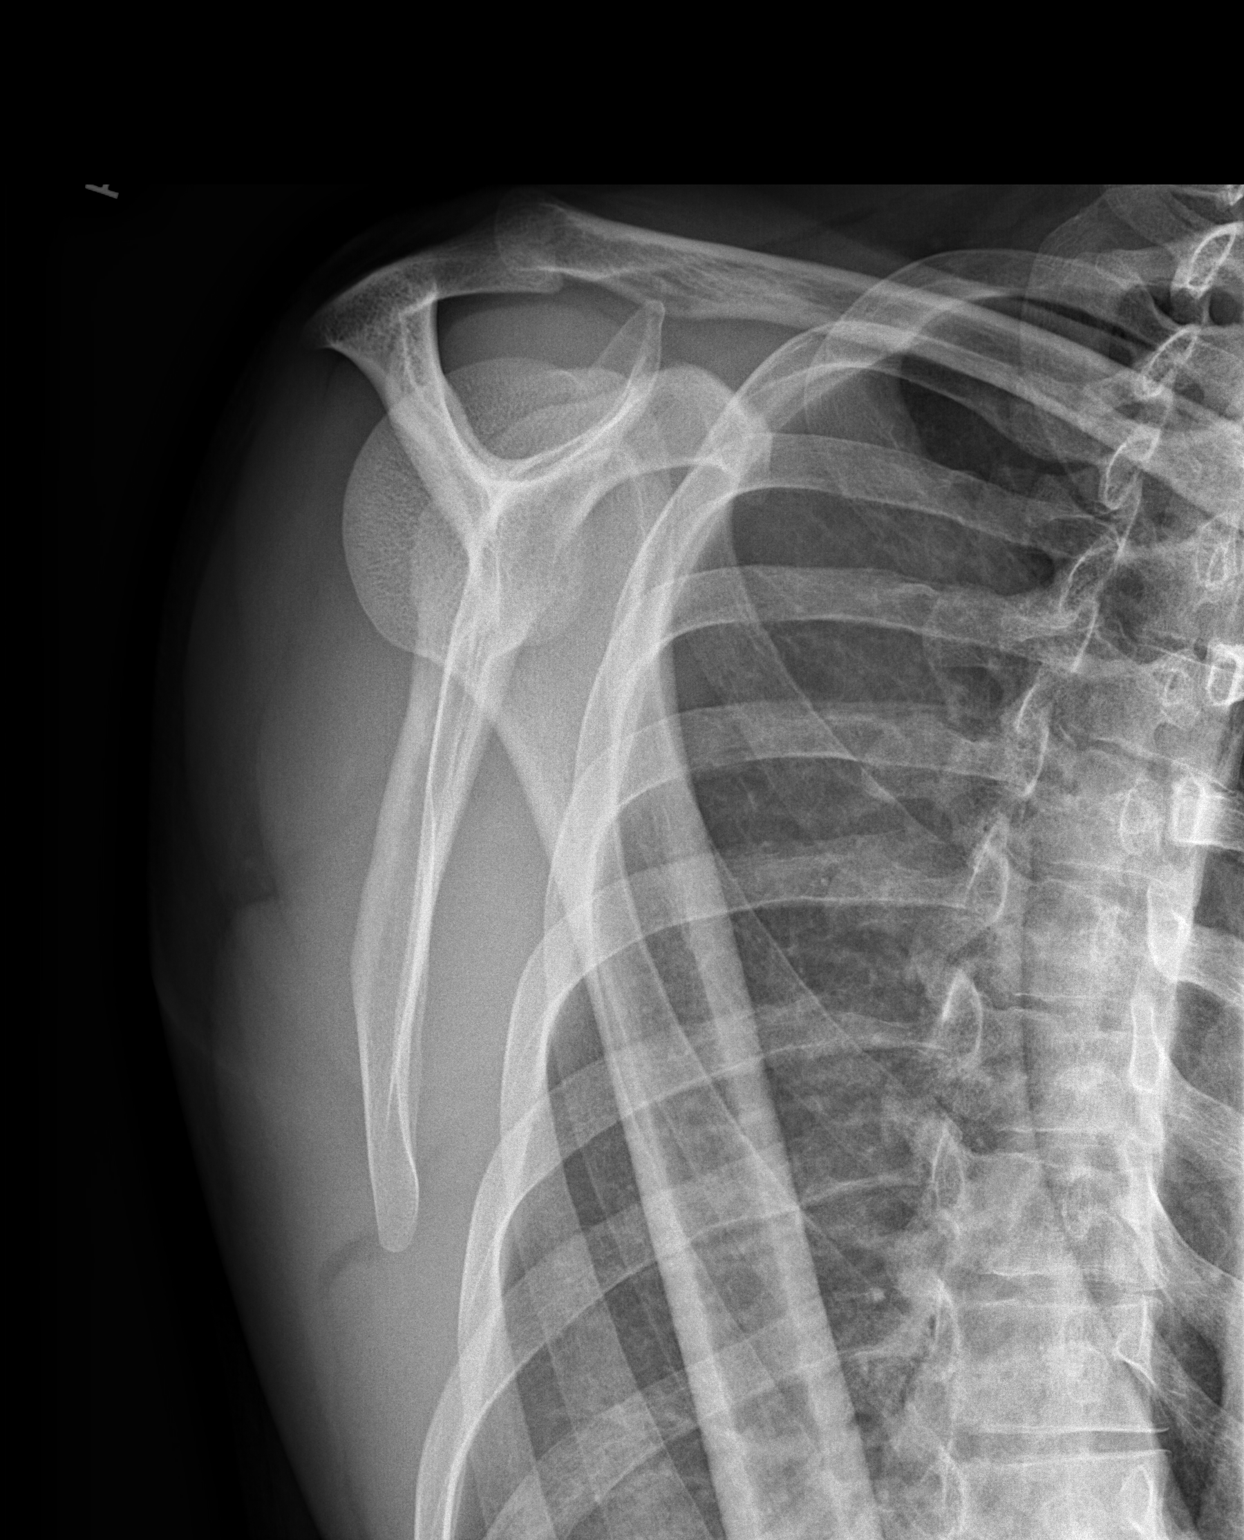

[x shoulder axillary right]
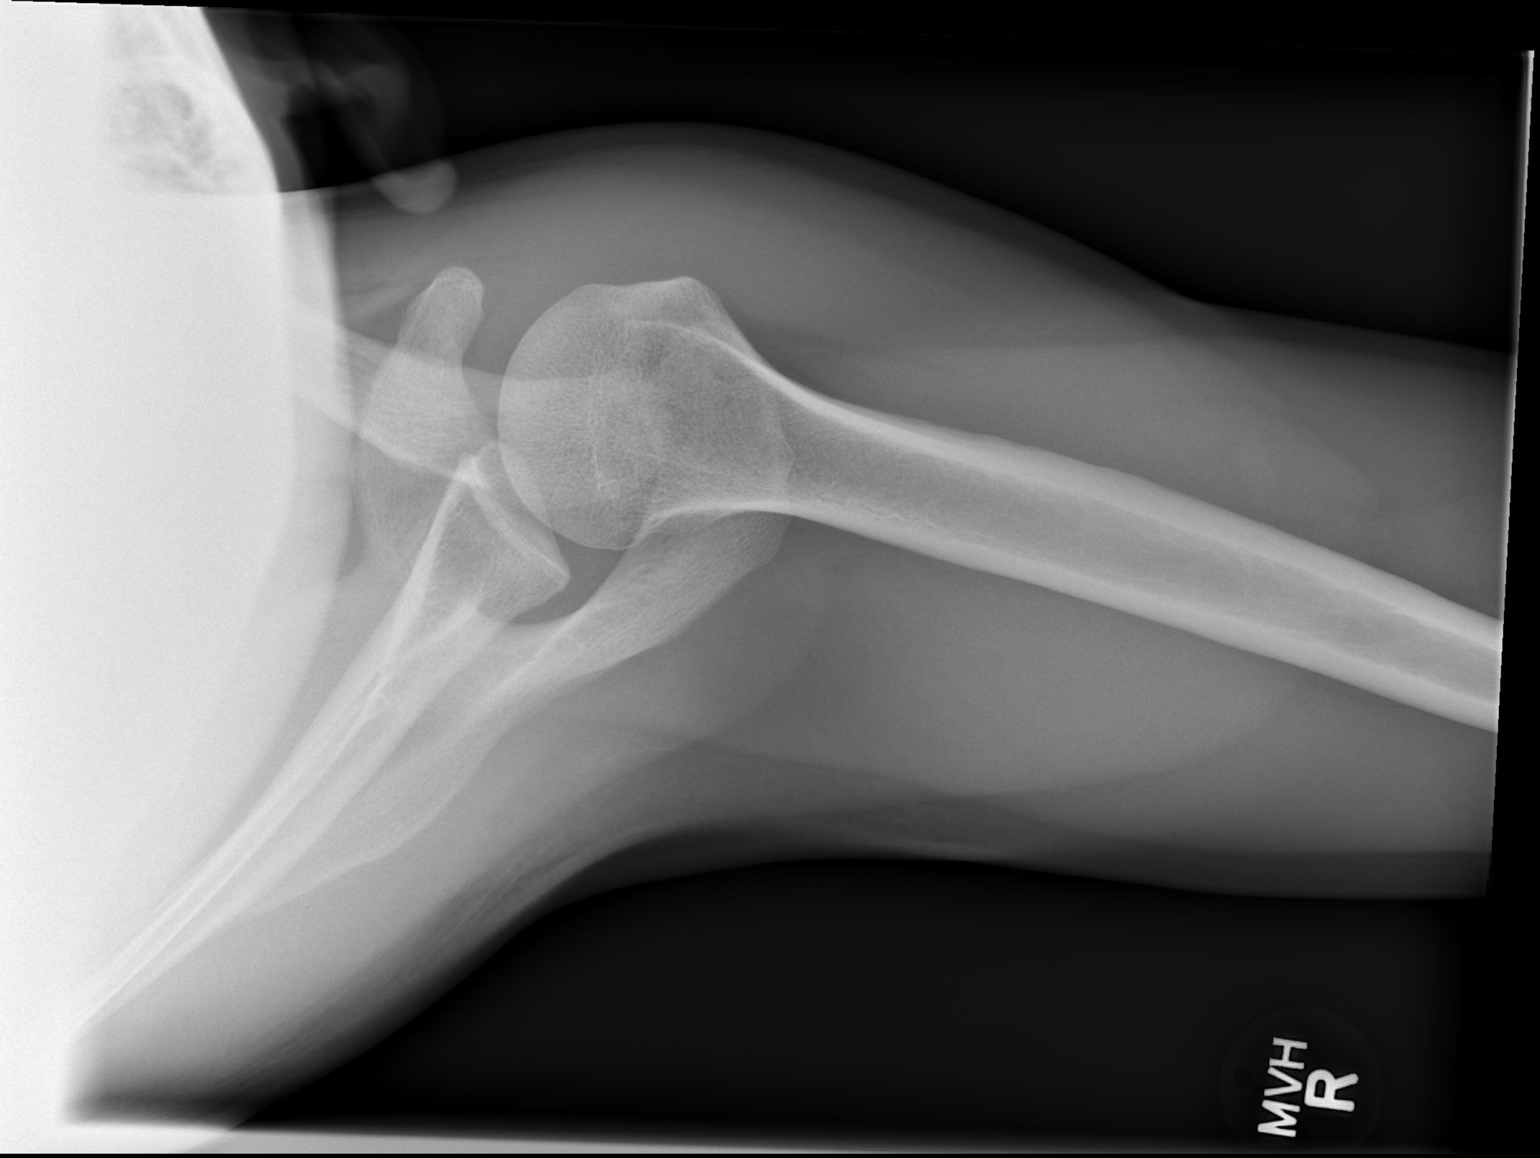

[3 of 3 positions shown; findings below may reference images not displayed]

FINDINGS: The joint spaces are maintained. No acute bony findings or bone
lesion. No abnormal soft tissue calcifications. The visualized lung
is clear and the visualized ribs are intact.
IMPRESSION: No fracture or dislocation.
